# Patient Record
Sex: Female | Born: 1960 | Race: White | Hispanic: No | Smoking: Never smoker
Health system: Southern US, Community
[De-identification: ages and names within clinical notes are randomized; demographics above are authoritative.]

## PROBLEM LIST (undated history)

## (undated) DIAGNOSIS — F419 Anxiety disorder, unspecified: Secondary | ICD-10-CM

## (undated) DIAGNOSIS — M1990 Unspecified osteoarthritis, unspecified site: Secondary | ICD-10-CM

## (undated) DIAGNOSIS — I10 Essential (primary) hypertension: Secondary | ICD-10-CM

## (undated) DIAGNOSIS — E119 Type 2 diabetes mellitus without complications: Secondary | ICD-10-CM

## (undated) DIAGNOSIS — E785 Hyperlipidemia, unspecified: Secondary | ICD-10-CM

## (undated) HISTORY — DX: Unspecified osteoarthritis, unspecified site: M19.90

## (undated) HISTORY — PX: ABDOMINAL HYSTERECTOMY: SUR658

## (undated) HISTORY — PX: ABDOMINAL HYSTERECTOMY: SHX81

## (undated) HISTORY — DX: Hyperlipidemia, unspecified: E78.5

## (undated) HISTORY — DX: Anxiety disorder, unspecified: F41.9

---

## 2011-06-09 ENCOUNTER — Ambulatory Visit (INDEPENDENT_AMBULATORY_CARE_PROVIDER_SITE_OTHER): Payer: Medicaid Other | Admitting: Family Medicine

## 2011-06-09 ENCOUNTER — Encounter: Payer: Self-pay | Admitting: Family Medicine

## 2011-06-09 DIAGNOSIS — E785 Hyperlipidemia, unspecified: Secondary | ICD-10-CM

## 2011-06-09 DIAGNOSIS — R1011 Right upper quadrant pain: Secondary | ICD-10-CM

## 2011-06-09 DIAGNOSIS — E119 Type 2 diabetes mellitus without complications: Secondary | ICD-10-CM

## 2011-06-09 DIAGNOSIS — I10 Essential (primary) hypertension: Secondary | ICD-10-CM

## 2011-06-09 LAB — COMPREHENSIVE METABOLIC PANEL
ALT: 14 U/L (ref 0–35)
AST: 15 U/L (ref 0–37)
Alkaline Phosphatase: 59 U/L (ref 39–117)
Glucose, Bld: 148 mg/dL — ABNORMAL HIGH (ref 70–99)
Potassium: 4.1 mEq/L (ref 3.5–5.3)
Sodium: 138 mEq/L (ref 135–145)
Total Bilirubin: 0.2 mg/dL — ABNORMAL LOW (ref 0.3–1.2)
Total Protein: 6.9 g/dL (ref 6.0–8.3)

## 2011-06-09 LAB — CBC WITH DIFFERENTIAL/PLATELET
Basophils Relative: 0 % (ref 0–1)
Eosinophils Absolute: 0.2 10*3/uL (ref 0.0–0.7)
Eosinophils Relative: 3 % (ref 0–5)
Lymphs Abs: 1.4 10*3/uL (ref 0.7–4.0)
MCH: 32.2 pg (ref 26.0–34.0)
MCHC: 33.9 g/dL (ref 30.0–36.0)
MCV: 95.1 fL (ref 78.0–100.0)
Platelets: 219 10*3/uL (ref 150–400)
RBC: 4.25 MIL/uL (ref 3.87–5.11)

## 2011-06-09 LAB — LIPID PANEL
HDL: 36 mg/dL — ABNORMAL LOW (ref >39)
LDL Cholesterol: 120 mg/dL — ABNORMAL HIGH (ref 0–99)
Total CHOL/HDL Ratio: 6.2 Ratio
VLDL: 66 mg/dL — ABNORMAL HIGH (ref 0–40)

## 2011-06-09 MED ORDER — ONDANSETRON HCL 4 MG PO TABS: 4.0000 mg | ORAL_TABLET | Freq: Three times a day (TID) | ORAL | Status: AC | PRN

## 2011-06-09 MED ORDER — ATENOLOL 25 MG PO TABS: 25.0000 mg | ORAL_TABLET | Freq: Every day | ORAL | Status: DC

## 2011-06-09 NOTE — Progress Notes (Signed)
Pt states that she has been experiencing pain in her RUQ for the past month. She states that the pain is constant but sometimes she experiences burning that will come and go along with some nausea. She feels better when she lies down. Laureen Ochs, Viann Shove   appt made for pt to have [place] @ [facility] on 03.12.2013 @ 815 am. Pt instructed to be NPO after midnight. Pt voiced understanding and agreed.Heath Gold '

## 2011-06-09 NOTE — Patient Instructions (Addendum)
It has been a pleasure to meet you today. I will call you with the labs results if they come back abnormal. Please make an follow up appointment as soon as ultrasound is done. If your symptoms worsen please schedule an appointment sooner. If your symptoms worsen or if fever, chills or not tolerating by mouth; please get evaluated right away.

## 2011-06-10 ENCOUNTER — Encounter: Payer: Self-pay | Admitting: Family Medicine

## 2011-06-10 DIAGNOSIS — R1011 Right upper quadrant pain: Secondary | ICD-10-CM | POA: Insufficient documentation

## 2011-06-10 DIAGNOSIS — E785 Hyperlipidemia, unspecified: Secondary | ICD-10-CM | POA: Insufficient documentation

## 2011-06-10 NOTE — Assessment & Plan Note (Signed)
No on current treatment Plan - Recheck lipid profile - Consider statin initiation if LDL above 100.

## 2011-06-10 NOTE — Assessment & Plan Note (Signed)
A1c 7.7.  -Continue metformin 500 daily. Ideal under 7, monitor next visit and probably increase metformin to 1000 BID

## 2011-06-10 NOTE — Assessment & Plan Note (Signed)
Most likely gall bladder, possible stones. Chronic in nature (1 month duration with no changes in intensity) a febrile some nausea present. Plan - Fat reduced diet - Abdominal Ultrasound - If worsening needs to be evaluated right away.

## 2011-06-10 NOTE — Progress Notes (Signed)
  Subjective:    Patient ID: Stacy Torres, female    DOB: [birth year], 51 y.o.   MRN: [id number]  HPI Patient that comes to establish care. She most recently to [facility] her daughter after her divorce. She has history of diabetes, HTN, and hyperlipidemia. Her main complaint today is right upper quadrant pain that started a month ago. She noticed it is related to food intake and colic in nature. No fevers, mild nausea and bilious, non-bloody one vomit, soften stools but not frank diarrhea. Past surgical history positive for complete hysterectomy 13 years ago. 1. Hypertension: Controlled with Benazepril 40 mg daily and atenolol 12.5 mg daily. Blood pressure today 142/82 the patient mentions she is usually controlled, these temporary elevation of blood pressure she attributes to pain. 2. DN: On metformin 500 mg daily. A1c today 7.7. No symptoms of hypoglycemia or secondary effects of metformin. 3. HLD: Does not recall how long ago was her last  Lipid profile check. Not pharmacologic treatment.   Review of Systems  Constitutional: Negative for fever, fatigue and unexpected weight change.  HENT: Negative.   Eyes: Negative.   Respiratory: Negative for cough and shortness of breath.   Cardiovascular: Negative for chest pain, palpitations and leg swelling.  Gastrointestinal: Positive for nausea, vomiting and abdominal pain. Negative for constipation, blood in stool and abdominal distention.  Genitourinary: Negative.   Musculoskeletal: Negative.   Skin: Negative.   Neurological: Negative.  Negative for headaches.  Psychiatric/Behavioral: Negative.        Objective:   Physical Exam  Constitutional: No distress.  HENT:  Mouth/Throat: Oropharynx is clear and moist.  Eyes: Conjunctivae and EOM are normal. Pupils are equal, round, and reactive to light.  Neck: Neck supple. No JVD present. No thyromegaly present.  Cardiovascular: Normal rate, regular rhythm and normal heart sounds.  Exam  reveals no gallop and no friction rub.   No murmur heard. Pulmonary/Chest: Effort normal and breath sounds normal. No respiratory distress.  Abdominal: Soft. Bowel sounds are normal. She exhibits no distension and no mass. There is tenderness. There is no rebound and no guarding.       RUQ tenderness. Positive Murphy sign. No hepatomegaly.  Lymphadenopathy:    She has no cervical adenopathy.  Skin: She is not diaphoretic.          Assessment & Plan:

## 2011-06-10 NOTE — Assessment & Plan Note (Addendum)
-  Continue treatment with beta blockers and ACE inhibitors. If blood pressures continue to be 140/90 consider increasing dose of current medications -Labs to evaluate creatinine/kidney function. -Continue monitoring.

## 2011-06-14 ENCOUNTER — Ambulatory Visit (HOSPITAL_COMMUNITY)
Admission: RE | Admit: 2011-06-14 | Discharge: 2011-06-14 | Disposition: A | Discharge status: Home / Self Care | Payer: Medicaid Other | Source: Ambulatory Visit | Attending: Family Medicine | Admitting: Family Medicine

## 2011-06-14 ENCOUNTER — Telehealth: Payer: Self-pay | Admitting: Family Medicine

## 2011-06-14 DIAGNOSIS — R1011 Right upper quadrant pain: Secondary | ICD-10-CM | POA: Insufficient documentation

## 2011-06-14 NOTE — Telephone Encounter (Signed)
Forwarded to pcp did not see any results for [place].Loralee Pacas [place]

## 2011-06-14 NOTE — Telephone Encounter (Signed)
Pt had [place] this AM and wants to know results asap.

## 2011-06-14 NOTE — Telephone Encounter (Signed)
Patient is calling initially to make appt for f/u after ultrasound.  The first appt Dr. Aviva Signs had open wasn't until 3/22 and she really thinks she needs to see Dr. Aviva Signs.  She is wondering if Dr. Aviva Signs could give her the results by phone.

## 2011-06-14 NOTE — Telephone Encounter (Signed)
Called pt and informed her that [place] results were neg. Negative abdominal ultrasound. Pt voiced understanding.Marland Kitchen Marland KitchenLoralee Pacas [place]ar

## 2011-06-29 NOTE — Telephone Encounter (Signed)
Has this been addressed?

## 2011-07-01 NOTE — Telephone Encounter (Signed)
Her abdominal ultrasound was normal. It is in review results under [place]. DP

## 2011-12-28 ENCOUNTER — Ambulatory Visit: Payer: Medicaid Other | Admitting: Family Medicine

## 2012-01-12 ENCOUNTER — Ambulatory Visit (INDEPENDENT_AMBULATORY_CARE_PROVIDER_SITE_OTHER): Payer: Medicaid Other | Admitting: Family Medicine

## 2012-01-12 ENCOUNTER — Encounter: Payer: Self-pay | Admitting: Family Medicine

## 2012-01-12 VITALS — BP 153/76 | HR 97 | Temp 99.1°F | Ht 60.0 in | Wt 191.4 lb

## 2012-01-12 DIAGNOSIS — E785 Hyperlipidemia, unspecified: Secondary | ICD-10-CM

## 2012-01-12 DIAGNOSIS — H9110 Presbycusis, unspecified ear: Secondary | ICD-10-CM

## 2012-01-12 DIAGNOSIS — I10 Essential (primary) hypertension: Secondary | ICD-10-CM

## 2012-01-12 DIAGNOSIS — H9113 Presbycusis, bilateral: Secondary | ICD-10-CM

## 2012-01-12 DIAGNOSIS — E119 Type 2 diabetes mellitus without complications: Secondary | ICD-10-CM

## 2012-01-12 DIAGNOSIS — K219 Gastro-esophageal reflux disease without esophagitis: Secondary | ICD-10-CM

## 2012-01-12 LAB — POCT GLYCOSYLATED HEMOGLOBIN (HGB A1C): Hemoglobin A1C: 8.3

## 2012-01-13 DIAGNOSIS — K219 Gastro-esophageal reflux disease without esophagitis: Secondary | ICD-10-CM | POA: Insufficient documentation

## 2012-01-13 MED ORDER — OMEPRAZOLE 20 MG PO CPDR: 20.0000 mg | DELAYED_RELEASE_CAPSULE | Freq: Every day | ORAL | Status: DC

## 2012-01-13 MED ORDER — LISINOPRIL 20 MG PO TABS: 20.0000 mg | ORAL_TABLET | Freq: Every day | ORAL | Status: DC

## 2012-01-15 NOTE — Progress Notes (Signed)
  Subjective:    Patient ID: Stacy Torres, female    DOB: 1960/12/15, 51 y.o.   MRN: [id number]  HPI Pt that comes today for follow up. Her abdominal discomfort is selective to certain foods (ie. Banana) and complains of acid reflux described as burning sensation that brings biter taste to her mouth. Denies visible blood/dark stools. Her other complaint is difficulty hearing: has been progressively worsening for the past years. Is mostly to hight pitch sounds. Denies headaches, tinnitus or dizziness associated with it. f/u DM: no very strict with diet lately. Taking metformin with no reported side effects or hypoglycemic events. F/u HTN: reports Benazepril has change color of pill and now is not as effective as use to be. Requests change to another antihypertensive medication.  Review of Systems Pt denies SOB, chest pain, palpitations, headaches, dizziness, numbness or weakness. No changes on urinary or BM habits. No unintentional weigh loss/gain.     Objective:   Physical Exam Gen:  NAD HEENT: Moist mucous membranes left ear with cerumen plug.  Normal TM bilaterally after removal of ear wax.  CV: Regular rate and rhythm, no murmurs rubs or gallops PULM: Clear to auscultation bilaterally.  ABD: Soft, non tender to palpation, non distended, normal bowel sounds EXT: No edema Neuro: Alert and oriented x3. No focalization Marena Chancy and Rinne examined after cerumen plug was extracted. No lateralization, bone conduction<air conduction. Audiogram only positive for frequency of 500 Hz. Rest of frequencies not heard bilaterally.      Assessment & Plan:

## 2012-01-17 DIAGNOSIS — H9113 Presbycusis, bilateral: Secondary | ICD-10-CM | POA: Insufficient documentation

## 2012-01-17 NOTE — Assessment & Plan Note (Addendum)
Symptoms consistent with GERD. Uncontrolled DM may be also having a component of autonomic dysfunction. No signs of GI bleed.  Plan: Protonix 40 mg daily and reevaluate in 1 month.

## 2012-01-17 NOTE — Assessment & Plan Note (Signed)
Pt requesting change in BP medication. Plan: Stop benazepril. Start Lisinopril 20 mg daily and keep a BP log. Reassess in a month or sooner if BP are elevated. Pt appropriately instructed.

## 2012-01-17 NOTE — Assessment & Plan Note (Signed)
Results of lipid profile discussed. Pt informed about risk of elevated LDL, Low HDL and hypertriglyceridemia on a DM pt that increases risk of cardiovascular events. Se decided to address this issue at her next appointment since she will be starting to take two new medications.

## 2012-01-17 NOTE — Assessment & Plan Note (Signed)
No lateralization on Webber. Normal Rinne. Audiogram only positive for low frequency sounds. No other symptoms, rest of neurologic exam intact.  Plan: Explained the early presentation of this condition and advise to get evaluated by ENT if pt would like to improve hearing with aids or if other symptoms appear. Pt does not desire to pursue more extensive evaluation at this point.

## 2012-01-17 NOTE — Assessment & Plan Note (Signed)
A1C 8.3 from 7.7. Pt on metformin reports compliance.  Plan: Considered adding Glipizide to her regimen. Pt will like to try with more strict diet and current medication, since she acknowledge is not watching what she is eating.

## 2012-02-09 ENCOUNTER — Ambulatory Visit: Payer: Medicaid Other | Admitting: Family Medicine

## 2012-03-21 ENCOUNTER — Encounter: Payer: Self-pay | Admitting: Family Medicine

## 2012-03-21 ENCOUNTER — Ambulatory Visit (INDEPENDENT_AMBULATORY_CARE_PROVIDER_SITE_OTHER): Payer: Medicaid Other | Admitting: Family Medicine

## 2012-03-21 VITALS — BP 163/67 | HR 79 | Temp 98.2°F | Ht 60.0 in | Wt 192.7 lb

## 2012-03-21 DIAGNOSIS — E669 Obesity, unspecified: Secondary | ICD-10-CM | POA: Insufficient documentation

## 2012-03-21 DIAGNOSIS — E119 Type 2 diabetes mellitus without complications: Secondary | ICD-10-CM

## 2012-03-21 DIAGNOSIS — E785 Hyperlipidemia, unspecified: Secondary | ICD-10-CM

## 2012-03-21 DIAGNOSIS — K219 Gastro-esophageal reflux disease without esophagitis: Secondary | ICD-10-CM

## 2012-03-21 DIAGNOSIS — I10 Essential (primary) hypertension: Secondary | ICD-10-CM

## 2012-03-21 MED ORDER — ATENOLOL 25 MG PO TABS: 25.0000 mg | ORAL_TABLET | Freq: Every day | ORAL | Status: DC

## 2012-03-21 MED ORDER — OMEPRAZOLE 20 MG PO CPDR: 20.0000 mg | DELAYED_RELEASE_CAPSULE | Freq: Every day | ORAL | Status: DC

## 2012-03-21 MED ORDER — ACCU-CHEK NANO SMARTVIEW W/DEVICE KIT: 1.0000 | PACK | Freq: Every day | Status: DC

## 2012-03-21 MED ORDER — ACCU-CHEK FASTCLIX LANCETS MISC: 1.0000 | Freq: Three times a day (TID) | Status: DC

## 2012-03-21 MED ORDER — GLUCOSE BLOOD VI STRP: ORAL_STRIP | Status: DC

## 2012-03-21 MED ORDER — METFORMIN HCL ER 500 MG PO TB24: 1000.0000 mg | ORAL_TABLET | Freq: Every day | ORAL | Status: DC

## 2012-03-21 MED ORDER — LISINOPRIL 40 MG PO TABS: 20.0000 mg | ORAL_TABLET | Freq: Every day | ORAL | Status: DC

## 2012-03-21 NOTE — Assessment & Plan Note (Signed)
No ready to start another medication. She would like to discussed it next appt. informed risks of elevated cholesterol on a DM patient. Pt voiced understanding.

## 2012-03-21 NOTE — Patient Instructions (Addendum)
It has been a pleasure to see you today. Please take the medications as prescribed. Continue taking lisinopril 20 mg daily (now the pill is 40 mg so you will be taking half) Increase atenolol to 1 tab a day (25 mg) Please keep a log of your Blood Pressure. For your diabetes continue taking metformin 1000 mg daily and we will recheck at your next appointment. Make your next appointment in 1-2 months.

## 2012-03-21 NOTE — Assessment & Plan Note (Signed)
Pt on atenolol 25 taking 1/2 tablet and Lisinopril 20 mg . BP has not been checked during this time. Here in the office today 163 systolic with normal pulse.  Plan: Incerase atenolol to 1 tab. Lisinopril increased to 40 mg prescription but discussed with pt start with 0.5 pil and if after change on atenolol Bp still above 140/90 start taking 1 full tablet daily. Instructed pt to keep a log of her BP to discuss at her next appointment.

## 2012-03-21 NOTE — Progress Notes (Signed)
Family Medicine Office Visit Note   Subjective:   Patient ID: Stacy Torres, female  DOB: 1960-07-22, 51 y.o.. MRN: [id number]   Pt that comes today to f/u hypertension.  HTN: reports to be compliant with meds and denies side effects. She has not been checked her BP since her last appointment here.  She reports her GI symptoms are controlled and feels better. Desires continue with protonix since she has noticed a significant improvement.  Pt declines today checking her A1C since she reports has not been eating properly and knows it will be high. Prefers to check on next appointment. She reports today that she does not have meter and interested in checking her CBG's at home.  Desires to postpone discussion of  HLD pharmacologic treatment  today. Would like to give diet a try.   Review of Systems:   Denies SOB, chest pain, palpitations, headaches, dizziness, numbness or weakness. No changes on urinary or BM habits. No unintentional weigh loss/gain.  Objective:   Physical Exam: Gen:  NAD HEENT: Moist mucous membranes  CV: Regular rate and rhythm, no murmurs rubs or gallops PULM: Clear to auscultation bilaterally.  ABD: Soft, non tender, non distended, normal bowel sounds EXT: No edema Neuro: Alert and oriented x3. No focalization  Assessment & Plan:

## 2012-03-21 NOTE — Assessment & Plan Note (Signed)
Declines A1C testing today. Compliant with meds. Non compliant with diet. Plan: Continue current regimen. Meter, lancets and strips prescribed. Teaching during office visit how to use them. Will recheck on her next appointment and adjust metformin dose.

## 2012-03-21 NOTE — Assessment & Plan Note (Signed)
Controlled with Protonix. Continue current treatment.

## 2012-06-11 ENCOUNTER — Ambulatory Visit: Payer: Medicaid Other | Admitting: Family Medicine

## 2012-08-06 ENCOUNTER — Ambulatory Visit (INDEPENDENT_AMBULATORY_CARE_PROVIDER_SITE_OTHER): Payer: Medicaid Other | Admitting: Family Medicine

## 2012-08-06 ENCOUNTER — Encounter: Payer: Self-pay | Admitting: Family Medicine

## 2012-08-06 VITALS — BP 128/73 | HR 92 | Temp 99.2°F | Ht 60.0 in | Wt 192.0 lb

## 2012-08-06 DIAGNOSIS — E785 Hyperlipidemia, unspecified: Secondary | ICD-10-CM

## 2012-08-06 DIAGNOSIS — I10 Essential (primary) hypertension: Secondary | ICD-10-CM

## 2012-08-06 DIAGNOSIS — E669 Obesity, unspecified: Secondary | ICD-10-CM

## 2012-08-06 DIAGNOSIS — E119 Type 2 diabetes mellitus without complications: Secondary | ICD-10-CM

## 2012-08-06 MED ORDER — METFORMIN HCL 1000 MG PO TABS: 1000.0000 mg | ORAL_TABLET | Freq: Two times a day (BID) | ORAL | Status: DC

## 2012-08-06 MED ORDER — ATENOLOL 25 MG PO TABS: 25.0000 mg | ORAL_TABLET | Freq: Every day | ORAL | Status: DC

## 2012-08-06 MED ORDER — LISINOPRIL 40 MG PO TABS: 20.0000 mg | ORAL_TABLET | Freq: Every day | ORAL | Status: DC

## 2012-08-06 NOTE — Patient Instructions (Addendum)
It has been a pleasure to see you today. Please take the medications as prescribed. I will call you with the labs results if they come back abnormal otherwise we will discuss them at your next appointment. Make your next appointment in 3-4 months or sooner if need.

## 2012-08-09 ENCOUNTER — Telehealth: Payer: Self-pay | Admitting: Family Medicine

## 2012-08-09 MED ORDER — ATORVASTATIN CALCIUM 40 MG PO TABS: 40.0000 mg | ORAL_TABLET | Freq: Every day | ORAL | Status: DC

## 2012-08-09 NOTE — Assessment & Plan Note (Signed)
Controlled.  P/ Continue current regimen.

## 2012-08-09 NOTE — Assessment & Plan Note (Signed)
Weight stable at 192 Lb. Pt not motivated to lose weigh at this time.

## 2012-08-09 NOTE — Assessment & Plan Note (Addendum)
A1C increasing. 8.6 today. No hypoglycemic episodes, denies polydipsia/polyuria. P/ -Increase metformin to 1000 mg BID -Will consider adding Glucotrol if DM continues uncontrolled. -pt on Lisinopril (no microalbuminuria analysis indicated) -Opthalmology referral done today -Will recheck labs for her next appointment 3-4 months (cbc, cmet, lipid panel)

## 2012-08-09 NOTE — Progress Notes (Signed)
Family Medicine Office Visit Note   Subjective:   Patient ID: Stacy Torres, female  DOB: [birth year], 52 y.o.. MRN: [id number]   Pt that comes today to f/u her chronic conditions. She reports feeling well and has no current complaints. She states is compliant with her treatment plan and denies side effect of medications noted.   Review of Systems:  Pt denies SOB, chest pain, palpitations, headaches, dizziness, numbness or weakness. No changes on urinary or BM habits. No unintentional weigh loss/gain.  Objective:   Physical Exam: Gen:  NAD HEENT: Moist mucous membranes  CV: Regular rate and rhythm, no murmurs rubs or gallops PULM: Clear to auscultation bilaterally. No wheezes/rales/rhonchi ABD: Soft, non tender, non distended, normal bowel sounds EXT: No edema Neuro: Alert and oriented x3. No focalization  Assessment & Plan:

## 2012-08-09 NOTE — Telephone Encounter (Signed)
Patient is calling because when she was seen on Tuesday, she was told that along with her normal 3 meds, there would a new one for Cholesterol, she says it is a "Statin", but the pharmacy has not received it yet.

## 2012-08-09 NOTE — Telephone Encounter (Signed)
Left message on voicemail. Sopheap Boehle S  

## 2012-08-09 NOTE — Assessment & Plan Note (Addendum)
Pt DM and estimated ASCVD risk of 7.9%  P/ High intensity statin therapy discussed with pt and agreeable Start Lipitor 40 mg

## 2012-08-09 NOTE — Telephone Encounter (Signed)
Medication sent to pharmacy. Sorry for the delay. Please communicate this to the pt.

## 2012-08-27 ENCOUNTER — Encounter (HOSPITAL_COMMUNITY): Payer: Self-pay | Admitting: Emergency Medicine

## 2012-08-27 ENCOUNTER — Emergency Department (HOSPITAL_COMMUNITY)
Admission: EM | Admit: 2012-08-27 | Discharge: 2012-08-28 | Discharge status: Against Medical Advice | Payer: Medicaid Other | Attending: Emergency Medicine | Admitting: Emergency Medicine

## 2012-08-27 DIAGNOSIS — Y9389 Activity, other specified: Secondary | ICD-10-CM | POA: Insufficient documentation

## 2012-08-27 DIAGNOSIS — Y92410 Unspecified street and highway as the place of occurrence of the external cause: Secondary | ICD-10-CM | POA: Insufficient documentation

## 2012-08-27 DIAGNOSIS — S3981XA Other specified injuries of abdomen, initial encounter: Secondary | ICD-10-CM | POA: Insufficient documentation

## 2012-08-27 HISTORY — DX: Type 2 diabetes mellitus without complications: E11.9

## 2012-08-27 HISTORY — DX: Essential (primary) hypertension: I10

## 2012-08-27 LAB — COMPREHENSIVE METABOLIC PANEL
ALT: 31 U/L (ref 0–35)
Albumin: 3.6 g/dL (ref 3.5–5.2)
Calcium: 10.6 mg/dL — ABNORMAL HIGH (ref 8.4–10.5)
GFR calc Af Amer: >90 mL/min (ref >90)
Glucose, Bld: 200 mg/dL — ABNORMAL HIGH (ref 70–99)
Sodium: 138 mEq/L (ref 135–145)
Total Protein: 7 g/dL (ref 6.0–8.3)

## 2012-08-27 LAB — URINALYSIS, ROUTINE W REFLEX MICROSCOPIC
Bilirubin Urine: NEGATIVE
Glucose, UA: NEGATIVE mg/dL
Hgb urine dipstick: NEGATIVE
Ketones, ur: NEGATIVE mg/dL
Leukocytes, UA: NEGATIVE
Nitrite: NEGATIVE
Protein, ur: NEGATIVE mg/dL
Specific Gravity, Urine: 1.019 (ref 1.005–1.030)
Urobilinogen, UA: 0.2 mg/dL (ref 0.0–1.0)
pH: 5.5 (ref 5.0–8.0)

## 2012-08-27 LAB — CBC WITH DIFFERENTIAL/PLATELET
Basophils Relative: 0 % (ref 0–1)
Eosinophils Absolute: 0.5 10*3/uL (ref 0.0–0.7)
Eosinophils Relative: 6 % — ABNORMAL HIGH (ref 0–5)
Lymphs Abs: 2.3 10*3/uL (ref 0.7–4.0)
MCH: 32.4 pg (ref 26.0–34.0)
MCHC: 34.5 g/dL (ref 30.0–36.0)
MCV: 94.1 fL (ref 78.0–100.0)
Platelets: 230 10*3/uL (ref 150–400)
RBC: 4.1 MIL/uL (ref 3.87–5.11)
RDW: 12.8 % (ref 11.5–15.5)

## 2012-08-27 LAB — POCT I-STAT TROPONIN I: Troponin i, poc: 0 ng/mL (ref 0.00–0.08)

## 2012-08-27 NOTE — ED Notes (Signed)
NURSE FIRST ROUNDS : NURSE EXPLAINED DELAY , WAIT TIME AND PROCESS , RESPIRATIONS UNLABORED , DENIES PAIN AT THIS TIME , AMBULATORY .
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
## 2012-08-27 NOTE — ED Notes (Signed)
Call no answer times 2 

## 2013-04-23 ENCOUNTER — Telehealth: Payer: Self-pay | Admitting: Home Health Services

## 2013-04-23 NOTE — Telephone Encounter (Signed)
LM for patient to schedule follow up diabetic visit with PCP[facility].  Needs A1c and LDL.

## 2013-08-26 ENCOUNTER — Telehealth: Payer: Self-pay | Admitting: Family Medicine

## 2013-08-26 NOTE — Telephone Encounter (Signed)
Pt called and needs refills on her Metformin and Atenolol. jw

## 2013-08-27 ENCOUNTER — Other Ambulatory Visit: Payer: Self-pay | Admitting: *Deleted

## 2013-08-27 DIAGNOSIS — E119 Type 2 diabetes mellitus without complications: Secondary | ICD-10-CM

## 2013-08-27 DIAGNOSIS — I10 Essential (primary) hypertension: Secondary | ICD-10-CM

## 2013-08-27 MED ORDER — LISINOPRIL 40 MG PO TABS: 20.0000 mg | ORAL_TABLET | Freq: Every day | ORAL | Status: DC

## 2013-08-27 MED ORDER — ATENOLOL 25 MG PO TABS: 25.0000 mg | ORAL_TABLET | Freq: Every day | ORAL | Status: DC

## 2013-08-27 MED ORDER — METFORMIN HCL 1000 MG PO TABS: 1000.0000 mg | ORAL_TABLET | Freq: Two times a day (BID) | ORAL | Status: DC

## 2013-08-27 NOTE — Telephone Encounter (Signed)
Prescription(s) have been sent

## 2013-08-27 NOTE — Telephone Encounter (Signed)
Patient calls again, completely out of the meds in the previous msg. Please refill today.

## 2014-05-25 ENCOUNTER — Telehealth: Payer: Self-pay | Admitting: Home Health Services

## 2014-05-25 NOTE — Telephone Encounter (Signed)
LVM time to schedule appointment with PCP for DM management.

## 2014-06-16 ENCOUNTER — Telehealth: Payer: Self-pay | Admitting: *Deleted

## 2014-06-16 NOTE — Telephone Encounter (Signed)
Left message on voicemail for patient to schedule an appointment for DM management.

## 2015-02-03 ENCOUNTER — Ambulatory Visit (INDEPENDENT_AMBULATORY_CARE_PROVIDER_SITE_OTHER): Payer: Medicaid Other | Admitting: Family Medicine

## 2015-02-03 ENCOUNTER — Encounter: Payer: Self-pay | Admitting: Family Medicine

## 2015-02-03 VITALS — BP 160/78 | HR 83 | Temp 98.8°F | Ht 60.0 in | Wt 179.0 lb

## 2015-02-03 DIAGNOSIS — H6693 Otitis media, unspecified, bilateral: Secondary | ICD-10-CM

## 2015-02-03 DIAGNOSIS — I10 Essential (primary) hypertension: Secondary | ICD-10-CM

## 2015-02-03 DIAGNOSIS — L409 Psoriasis, unspecified: Secondary | ICD-10-CM | POA: Insufficient documentation

## 2015-02-03 DIAGNOSIS — L989 Disorder of the skin and subcutaneous tissue, unspecified: Secondary | ICD-10-CM

## 2015-02-03 DIAGNOSIS — H7293 Unspecified perforation of tympanic membrane, bilateral: Secondary | ICD-10-CM

## 2015-02-03 DIAGNOSIS — E119 Type 2 diabetes mellitus without complications: Secondary | ICD-10-CM | POA: Diagnosis not present

## 2015-02-03 DIAGNOSIS — K219 Gastro-esophageal reflux disease without esophagitis: Secondary | ICD-10-CM | POA: Diagnosis not present

## 2015-02-03 DIAGNOSIS — Z0000 Encounter for general adult medical examination without abnormal findings: Secondary | ICD-10-CM | POA: Insufficient documentation

## 2015-02-03 DIAGNOSIS — E785 Hyperlipidemia, unspecified: Secondary | ICD-10-CM | POA: Diagnosis not present

## 2015-02-03 LAB — POCT SKIN KOH: Skin KOH, POC: NEGATIVE

## 2015-02-03 LAB — POCT GLYCOSYLATED HEMOGLOBIN (HGB A1C): HEMOGLOBIN A1C: 8.7

## 2015-02-03 MED ORDER — ATENOLOL 25 MG PO TABS: 25.0000 mg | ORAL_TABLET | Freq: Every day | ORAL | Status: DC

## 2015-02-03 MED ORDER — FLUTICASONE PROPIONATE 50 MCG/ACT NA SUSP: 2.0000 | Freq: Every day | NASAL | Status: DC

## 2015-02-03 MED ORDER — ATORVASTATIN CALCIUM 40 MG PO TABS: 40.0000 mg | ORAL_TABLET | Freq: Every day | ORAL | Status: DC

## 2015-02-03 MED ORDER — OMEPRAZOLE 20 MG PO CPDR: 20.0000 mg | DELAYED_RELEASE_CAPSULE | Freq: Every day | ORAL | Status: DC

## 2015-02-03 MED ORDER — LISINOPRIL 40 MG PO TABS: 20.0000 mg | ORAL_TABLET | Freq: Every day | ORAL | Status: DC

## 2015-02-03 MED ORDER — CLOBETASOL PROPIONATE 0.05 % EX OINT: 1.0000 "application " | TOPICAL_OINTMENT | Freq: Two times a day (BID) | CUTANEOUS | Status: DC

## 2015-02-03 MED ORDER — GLIPIZIDE 5 MG PO TABS: 5.0000 mg | ORAL_TABLET | Freq: Every day | ORAL | Status: DC

## 2015-02-03 MED ORDER — AMOXICILLIN-POT CLAVULANATE 875-125 MG PO TABS: 1.0000 | ORAL_TABLET | Freq: Two times a day (BID) | ORAL | Status: DC

## 2015-02-03 MED ORDER — OFLOXACIN 0.3 % OP SOLN: 10.0000 [drp] | Freq: Two times a day (BID) | OPHTHALMIC | Status: DC

## 2015-02-03 NOTE — Assessment & Plan Note (Signed)
-   Prescription for Augmentin double strength given to take BID x10 days - Ofloxacin drops - Flonase - Return in fever develops

## 2015-02-03 NOTE — Assessment & Plan Note (Addendum)
-   KOH negative - Clobetazole x2 weeks. Avoid face, axilla, and armpits - Follow up in two weeks

## 2015-02-03 NOTE — Progress Notes (Signed)
Subjective:     Patient ID: Stacy Torres, female   DOB: [birth year], 54 y.o.   MRN: [id number]  HPI Mrs. Nwosu is a 54yo female presenting today for annual physical. # Diabetes: - Last A1C of 8.6 in 08/06/2012 - Currently prescribed Metformin 1000mg  BID - States sugars normally range from 90s to 120s. She normally checks her blood sugars in the mornings, however she occasionally checks at night as well  # Hypertension: - Requests refill of Atenolol and Lisinopril - States she has been out of medication for several days - States that on medication, her blood pressure normally runs in 130s/60s  # Rash: - Notes circular lesions on arms and hands bilaterally and left leg. Has been present for several years. Itches. Not painful - Has been told in the past it is ringworm. Reports she lives with several dogs. - Reports history of psoriasis. Note chart review without any mention of psoriasis and not currently on problem list. Not currently on medication.  # Hyperlipidemia: - Requests refill of Atorvastatin. Has been out of this medication for a long time.  # GERD: - Requests refill of Omeprazole - Has been using over the counter pepcid since she has been out of her prescription medication - Notes epigastric abdominal pain when lying supine. No chest pain with exertion  # Ear Pain: - Notes bilateral ear pain with green drainage - Has been present x3 months - Denies fever or other systemic symptoms - Has history of prior ear infection many years ago  # Health Maintenance: - History of full hysterectomy including cervix and ovaries more than ten years ago. No longer requiring pap smears. - Refuses colonoscopy - Not up to date on mammograms, with last one a few years ago - Refuses flu shot. Notes history of Guillain-Barre.   Review of Systems Per HPI    Objective:   Physical Exam  Constitutional: She appears well-developed and well-nourished. No distress.  HENT:  Head:  Normocephalic and atraumatic.  Mouth/Throat: Oropharynx is clear and moist.  Ruptured tympanic membrane with drainage noted bilaterally with some erythema of tympanic canal.  Cardiovascular: Normal rate and regular rhythm.  Exam reveals no gallop and no friction rub.   No murmur heard. Pulmonary/Chest: Effort normal. No respiratory distress. She has no wheezes. She has no rales.  Abdominal: Soft. Bowel sounds are normal. She exhibits no distension. There is no tenderness.  Musculoskeletal: She exhibits no edema.  Wasting of right lower extremity (secondary to Guillain-Barre per patient) wit limb noticably smaller than left lower extremity, negative Homan's sign bilaterally  Lymphadenopathy:    She has no cervical adenopathy.  Skin: Skin is warm.  Circular lesion with erythematous raised edges noted on forearms bilaterally, less symmetrical lesion with erythematous raised edged and silver plaque noted on right hand and left lower leg.  Psychiatric: She has a normal mood and affect. Her behavior is normal.           Assessment and Plan:     HTN (hypertension) - Refill of Lisinopril and Atenolol given. Has been out of medication for several days. - Recheck at next visit  GERD (gastroesophageal reflux disease) - Refill of Omeprazole given  DM (diabetes mellitus) - A1C 8.7, up from 8.6 two years ago - Continue Metformin 1000mg  BID - Initiate Glipizide 5mg  with breakfast - Contact office if adverse effects, such as hypoglycemia, occurs with medication - Counseled on diet and exercise.  - Follow up in two weeks to discuss Glipizide.  Follow up in three months to recheck A1C  Hyperlipidemia - Refill of Atorvastatin given  Otitis media of both ears with spontaneous rupture of tympanic membrane - Prescription for Augmentin double strength given to take BID x10 days - Ofloxacin drops - Flonase - Return in fever develops  Psoriasis - KOH negative - Clobetazole x2 weeks. Avoid  face, axilla, and armpits - Follow up in two weeks  Health care maintenance - Handout given concerning mammogram - Refuses colonoscopy. Discussed importance of screening. To contact office if she changes her mind

## 2015-02-03 NOTE — Assessment & Plan Note (Signed)
-   Handout given concerning mammogram - Refuses colonoscopy. Discussed importance of screening. To contact office if she changes her mind

## 2015-02-03 NOTE — Assessment & Plan Note (Signed)
-   Refill of Atorvastatin given

## 2015-02-03 NOTE — Assessment & Plan Note (Signed)
-   Refill of Omeprazole given

## 2015-02-03 NOTE — Assessment & Plan Note (Addendum)
-   A1C 8.7, up from 8.6 two years ago - Continue Metformin 1000mg  BID - Initiate Glipizide 5mg  with breakfast - Contact office if adverse effects, such as hypoglycemia, occurs with medication - Counseled on diet and exercise.  - Follow up in two weeks to discuss Glipizide. Follow up in three months to recheck A1C

## 2015-02-03 NOTE — Patient Instructions (Signed)
Thanks for coming to see me today! I have sent in a refill of your medications. I have also sent in an antibiotic for you to take twice a day for the next ten days and a nasal spray to help with the drainage.  Please call and schedule an appointment for a mammogram. I will let you know the results of your skin scraping and send a medication to the pharmacy when I can.  Thanks again! Dr. Gerlean Ren

## 2015-02-03 NOTE — Assessment & Plan Note (Signed)
-   Refill of Lisinopril and Atenolol given. Has been out of medication for several days. - Recheck at next visit

## 2015-02-05 ENCOUNTER — Telehealth: Payer: Self-pay | Admitting: Family Medicine

## 2015-02-05 DIAGNOSIS — E119 Type 2 diabetes mellitus without complications: Secondary | ICD-10-CM

## 2015-02-05 MED ORDER — METFORMIN HCL 1000 MG PO TABS: 1000.0000 mg | ORAL_TABLET | Freq: Two times a day (BID) | ORAL | Status: DC

## 2015-02-05 NOTE — Telephone Encounter (Signed)
Called to confirm understanding of all medications prescribed given high number of changes. States she understands all changes and is feeling much better. Will follow up in 2-3 weeks to monitor for improvement of ear infection and rash.  Requests refill of Metformin, which was given.  Dr. Gerlean Ren

## 2015-03-22 ENCOUNTER — Telehealth: Payer: Self-pay | Admitting: Family Medicine

## 2015-03-22 DIAGNOSIS — I10 Essential (primary) hypertension: Secondary | ICD-10-CM

## 2015-03-22 MED ORDER — LISINOPRIL 40 MG PO TABS: 20.0000 mg | ORAL_TABLET | Freq: Every day | ORAL | Status: DC

## 2015-03-22 MED ORDER — LISINOPRIL 40 MG PO TABS: 40.0000 mg | ORAL_TABLET | Freq: Every day | ORAL | Status: DC

## 2015-03-22 NOTE — Telephone Encounter (Signed)
Needs refill on lisionpril.  The last RX was for 20 mg and that's not strong enough. Last pill was taken [facility] on [address]

## 2015-03-22 NOTE — Telephone Encounter (Signed)
All prescriptions for Lisinopril in system for 20mg . Contacted patient and she states a doctor in another system increased prescription to 40mg  and she has been on this for the last two years. Did not mention increase in dosage at last office visit. May take Lisinopril 40mg  daily. Instructed to follow up in office for blood pressure check.

## 2015-03-22 NOTE — Telephone Encounter (Signed)
Pt called back and does not understand why the doctor decreased her Lisinopril. Please call the patient. jw

## 2015-03-22 NOTE — Telephone Encounter (Signed)
Please have her schedule a follow up appointment to check her blood pressure and adjust her medication if needed.

## 2015-04-07 ENCOUNTER — Ambulatory Visit (INDEPENDENT_AMBULATORY_CARE_PROVIDER_SITE_OTHER): Payer: Medicaid Other | Admitting: Family Medicine

## 2015-04-07 ENCOUNTER — Encounter: Payer: Self-pay | Admitting: Family Medicine

## 2015-04-07 VITALS — BP 140/69 | HR 89 | Temp 98.3°F | Ht 60.0 in | Wt 182.2 lb

## 2015-04-07 DIAGNOSIS — E119 Type 2 diabetes mellitus without complications: Secondary | ICD-10-CM

## 2015-04-07 DIAGNOSIS — I10 Essential (primary) hypertension: Secondary | ICD-10-CM

## 2015-04-07 DIAGNOSIS — B372 Candidiasis of skin and nail: Secondary | ICD-10-CM | POA: Diagnosis not present

## 2015-04-07 MED ORDER — LISINOPRIL 40 MG PO TABS: 40.0000 mg | ORAL_TABLET | Freq: Every day | ORAL | Status: DC

## 2015-04-07 MED ORDER — ZINC OXIDE 20 % EX OINT: 1.0000 "application " | TOPICAL_OINTMENT | Freq: Two times a day (BID) | CUTANEOUS | Status: DC

## 2015-04-07 NOTE — Patient Instructions (Signed)
Thank you so much for coming to visit me today! I have sent in a refill for Lisinopril and a prescription for a cream that will work as an antifungal agent and a barrier cream.  Follow up in 1-2 months to check your A1C. Please record your blood pressures and blood sugars and bring them to your next visit.  Thanks again! Dr. Gerlean Ren

## 2015-04-10 DIAGNOSIS — B372 Candidiasis of skin and nail: Secondary | ICD-10-CM | POA: Insufficient documentation

## 2015-04-10 NOTE — Assessment & Plan Note (Addendum)
-   Rash consistent with candidal infection - Hydrocortisone-Nystatin-Zinc Oxide cream prescribed - Follow up at next office visit

## 2015-04-10 NOTE — Assessment & Plan Note (Signed)
-   Has not been check blood sugars, but has equipment at home to do so and does not need refills - Recommend checking blood sugars and recording in notebook. To bring notebook to next office visit - Follow up in one month for A1C check

## 2015-04-10 NOTE — Progress Notes (Signed)
Subjective:     Patient ID: Stacy Torres, female   DOB: 07-05-1960, 55 y.o.   MRN: [id number]  HPI Stacy Torres is a 55yo female presenting today to discuss medication change. - Recently transitioned from Lisinopril 20mg  to 40mg  - Also recently initiated Glipizide - Denies any side effects or complications with either medication - States blood pressure has been much better controlled at home, but cannot recall what they are and has not been writing them down. - Notes tenderness and rash at top of intergluteal cleft. Has been present for several days.   - History reviewed  Review of Systems Per HPI. Other systems negative.    Objective:   Physical Exam  Constitutional: She appears well-developed and well-nourished. No distress.  HENT:  Head: Normocephalic and atraumatic.  Cardiovascular: Normal rate and regular rhythm.  Exam reveals no gallop and no friction rub.   No murmur heard. Pulmonary/Chest: Effort normal. No respiratory distress. She has no wheezes.  Abdominal: Soft. She exhibits no distension. There is no tenderness.  Skin:  Raw erythematous rash with satellite lesions noted at superior intergluteal cleft, no ulcerations noted  Psychiatric: She has a normal mood and affect. Her behavior is normal.        Assessment and Plan:     HTN (hypertension) - Blood pressure improved to 140/69 today from those reported at home - Recommend obtaining notebook and recording readings at home to be followed up at next office visit  DM (diabetes mellitus) - Has not been check blood sugars, but has equipment at home to do so and does not need refills - Recommend checking blood sugars and recording in notebook. To bring notebook to next office visit - Follow up in one month for A1C check  Candidal skin infection - Rash consistent with candidal infection - Hydrocortisone-Nystatin-Zinc Oxide cream prescribed - Follow up at next office visit

## 2015-04-10 NOTE — Assessment & Plan Note (Signed)
-   Blood pressure improved to 140/69 today from those reported at home - Recommend obtaining notebook and recording readings at home to be followed up at next office visit

## 2016-01-10 ENCOUNTER — Other Ambulatory Visit: Payer: Self-pay | Admitting: Family Medicine

## 2016-01-10 DIAGNOSIS — I10 Essential (primary) hypertension: Secondary | ICD-10-CM

## 2016-02-18 ENCOUNTER — Other Ambulatory Visit: Payer: Self-pay | Admitting: Family Medicine

## 2016-02-18 DIAGNOSIS — I10 Essential (primary) hypertension: Secondary | ICD-10-CM

## 2016-02-18 DIAGNOSIS — K219 Gastro-esophageal reflux disease without esophagitis: Secondary | ICD-10-CM

## 2016-02-18 MED ORDER — ATENOLOL 25 MG PO TABS: 25.0000 mg | ORAL_TABLET | Freq: Every day | ORAL | 3 refills | Status: DC

## 2016-02-18 NOTE — Telephone Encounter (Signed)
Pt is calling for a refill on her Atenolol. She said that the pharmacy sent [place] a fax last Friday and she has not heard anything. We did have our fax machine down all Friday and through the weekend. Can we get this called in today. jw

## 2016-02-29 ENCOUNTER — Encounter: Payer: Medicaid Other | Admitting: Family Medicine

## 2016-03-24 ENCOUNTER — Encounter: Payer: Medicaid Other | Admitting: Family Medicine

## 2016-04-07 ENCOUNTER — Encounter: Payer: Medicaid Other | Admitting: Family Medicine

## 2016-04-14 ENCOUNTER — Encounter: Payer: Self-pay | Admitting: Family Medicine

## 2016-04-14 ENCOUNTER — Other Ambulatory Visit: Payer: Self-pay | Admitting: *Deleted

## 2016-04-14 ENCOUNTER — Ambulatory Visit (INDEPENDENT_AMBULATORY_CARE_PROVIDER_SITE_OTHER): Payer: Medicaid Other | Admitting: Family Medicine

## 2016-04-14 VITALS — BP 132/74 | HR 66 | Temp 98.1°F | Ht 60.0 in | Wt 169.8 lb

## 2016-04-14 DIAGNOSIS — L409 Psoriasis, unspecified: Secondary | ICD-10-CM | POA: Diagnosis not present

## 2016-04-14 DIAGNOSIS — K219 Gastro-esophageal reflux disease without esophagitis: Secondary | ICD-10-CM

## 2016-04-14 DIAGNOSIS — I10 Essential (primary) hypertension: Secondary | ICD-10-CM | POA: Diagnosis not present

## 2016-04-14 DIAGNOSIS — E119 Type 2 diabetes mellitus without complications: Secondary | ICD-10-CM | POA: Diagnosis present

## 2016-04-14 DIAGNOSIS — E784 Other hyperlipidemia: Secondary | ICD-10-CM | POA: Diagnosis not present

## 2016-04-14 DIAGNOSIS — Z114 Encounter for screening for human immunodeficiency virus [HIV]: Secondary | ICD-10-CM | POA: Diagnosis not present

## 2016-04-14 DIAGNOSIS — Z1159 Encounter for screening for other viral diseases: Secondary | ICD-10-CM

## 2016-04-14 DIAGNOSIS — E7849 Other hyperlipidemia: Secondary | ICD-10-CM

## 2016-04-14 DIAGNOSIS — F322 Major depressive disorder, single episode, severe without psychotic features: Secondary | ICD-10-CM | POA: Diagnosis not present

## 2016-04-14 DIAGNOSIS — Z0000 Encounter for general adult medical examination without abnormal findings: Secondary | ICD-10-CM | POA: Diagnosis not present

## 2016-04-14 DIAGNOSIS — M2042 Other hammer toe(s) (acquired), left foot: Secondary | ICD-10-CM | POA: Diagnosis not present

## 2016-04-14 LAB — CBC
HCT: 38.2 % (ref 35.0–45.0)
Hemoglobin: 12.5 g/dL (ref 11.7–15.5)
MCH: 30.9 pg (ref 27.0–33.0)
MCHC: 32.7 g/dL (ref 32.0–36.0)
MCV: 94.6 fL (ref 80.0–100.0)
MPV: 10.7 fL (ref 7.5–12.5)
Platelets: 278 10*3/uL (ref 140–400)
RBC: 4.04 MIL/uL (ref 3.80–5.10)
RDW: 14.3 % (ref 11.0–15.0)
WBC: 7.2 10*3/uL (ref 3.8–10.8)

## 2016-04-14 LAB — COMPLETE METABOLIC PANEL WITH GFR
ALBUMIN: 3.9 g/dL (ref 3.6–5.1)
ALT: 18 U/L (ref 6–29)
AST: 23 U/L (ref 10–35)
Alkaline Phosphatase: 54 U/L (ref 33–130)
BUN: 10 mg/dL (ref 7–25)
CALCIUM: 9.6 mg/dL (ref 8.6–10.4)
CHLORIDE: 102 mmol/L (ref 98–110)
CO2: 26 mmol/L (ref 20–31)
CREATININE: 0.82 mg/dL (ref 0.50–1.05)
GFR, Est African American: >89 mL/min (ref >=60)
GFR, Est Non African American: 80 mL/min (ref >=60)
Glucose, Bld: 209 mg/dL — ABNORMAL HIGH (ref 65–99)
Potassium: 4.3 mmol/L (ref 3.5–5.3)
Sodium: 138 mmol/L (ref 135–146)
Total Bilirubin: 0.4 mg/dL (ref 0.2–1.2)
Total Protein: 6.6 g/dL (ref 6.1–8.1)

## 2016-04-14 LAB — LIPID PANEL
Cholesterol: 255 mg/dL — ABNORMAL HIGH (ref <200)
HDL: 45 mg/dL — ABNORMAL LOW (ref >50)
LDL CALC: 152 mg/dL — AB (ref <100)
Total CHOL/HDL Ratio: 5.7 Ratio — ABNORMAL HIGH (ref <5.0)
Triglycerides: 290 mg/dL — ABNORMAL HIGH (ref <150)
VLDL: 58 mg/dL — AB (ref <30)

## 2016-04-14 LAB — POCT GLYCOSYLATED HEMOGLOBIN (HGB A1C): HEMOGLOBIN A1C: 10

## 2016-04-14 LAB — TSH: TSH: 1.74 mIU/L

## 2016-04-14 MED ORDER — ACCU-CHEK FASTCLIX LANCETS MISC: 1.0000 | Freq: Three times a day (TID) | 3 refills | Status: DC

## 2016-04-14 MED ORDER — OMEPRAZOLE 20 MG PO CPDR: 20.0000 mg | DELAYED_RELEASE_CAPSULE | Freq: Every day | ORAL | 1 refills | Status: DC | PRN

## 2016-04-14 MED ORDER — GLIPIZIDE 5 MG PO TABS: 5.0000 mg | ORAL_TABLET | Freq: Every day | ORAL | 3 refills | Status: DC

## 2016-04-14 MED ORDER — GLUCOSE BLOOD VI STRP: ORAL_STRIP | 3 refills | Status: DC

## 2016-04-14 MED ORDER — ATORVASTATIN CALCIUM 40 MG PO TABS: 40.0000 mg | ORAL_TABLET | Freq: Every day | ORAL | 3 refills | Status: DC

## 2016-04-14 MED ORDER — SERTRALINE HCL 50 MG PO TABS: 50.0000 mg | ORAL_TABLET | Freq: Every day | ORAL | 0 refills | Status: DC

## 2016-04-14 MED ORDER — ACCU-CHEK NANO SMARTVIEW W/DEVICE KIT: 1.0000 | PACK | Freq: Every day | 0 refills | Status: DC

## 2016-04-14 MED ORDER — ATENOLOL 25 MG PO TABS: 25.0000 mg | ORAL_TABLET | Freq: Every day | ORAL | 3 refills | Status: DC

## 2016-04-14 MED ORDER — METFORMIN HCL 1000 MG PO TABS: 1000.0000 mg | ORAL_TABLET | Freq: Two times a day (BID) | ORAL | 3 refills | Status: DC

## 2016-04-14 MED ORDER — LISINOPRIL 40 MG PO TABS: 40.0000 mg | ORAL_TABLET | Freq: Every day | ORAL | 3 refills | Status: DC

## 2016-04-14 NOTE — Patient Instructions (Addendum)
Thank you so much for coming to visit today! I'm sorry you are going through such a difficult time lately! I have sent a prescription for Zoloft to the pharmacy. You may take the prescribed dose for one week and if tolerated well, you may increase to two tablets a day until you return. I strongly encourage following up with Dr. Gwenlyn Saran either in person or on the phone.  If you have any thoughts about hurting yourself, pleas call 911 immediately. Please return in 2 weeks for a visit focused only on your depression and anxiety.   Your A1C is elevated today to 10. Please restart your Glipizide. Check your blood sugar 1-2 times daily, before breakfast and an hour after your largest meal of the day. Please write down your blood sugars and bring them to your next office visit.  We will check multiple labs today. We will mail you a letter with the results.  Referrals have been placed to Sports Medicine for Orthotics and to Dermatology for Psoriasis.   Dr. Gerlean Ren  Suicide Prevention Hotline: [phone]

## 2016-04-15 LAB — HIV ANTIBODY (ROUTINE TESTING W REFLEX): HIV: NONREACTIVE

## 2016-04-15 LAB — HEPATITIS C ANTIBODY: HCV Ab: NEGATIVE

## 2016-04-17 DIAGNOSIS — F32A Depression, unspecified: Secondary | ICD-10-CM | POA: Insufficient documentation

## 2016-04-17 DIAGNOSIS — F329 Major depressive disorder, single episode, unspecified: Secondary | ICD-10-CM | POA: Insufficient documentation

## 2016-04-17 NOTE — Assessment & Plan Note (Signed)
Referral to Dermatology

## 2016-04-17 NOTE — Assessment & Plan Note (Signed)
PHQ9 Score of 27. GAD7 Score of 20. Denies suicidal ideation at this time. Number for National Suicide Hotline given. Met with Behavioral Health to discuss returning for visits with them vs. Phone visits. Initiated on Zoloft with plans to titrate up after one week. Discussed need to schedule appointment just to discuss this concern in the coming weeks.

## 2016-04-17 NOTE — Assessment & Plan Note (Addendum)
No longer requires pap smears. Refusing Colonoscopy. Mammogram needed. Will check CMP, CBC, TSH, Lipid Panel. Also due for one time screening of HIV and Hepatitis C. Atorvastatin, Atenolol, Lisinopril refilled. Referral to Sports Medicine for evaluation for orthotics

## 2016-04-17 NOTE — Assessment & Plan Note (Signed)
Stable. Continue Lisinopril and Atenolol.
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
## 2016-04-17 NOTE — Assessment & Plan Note (Signed)
A1C 10 today. Recommended initiation of insulin, but Stacy Torres refuses. Continue Metformin and will restart Glipizide. Anticipate need for initiation of another agent as well. Prescription for glucometer given with recommendations to check blood sugar twice daily, once before breakfast and once after the largest meal of the day.

## 2016-04-18 ENCOUNTER — Telehealth: Payer: Self-pay | Admitting: Family Medicine

## 2016-04-18 NOTE — Telephone Encounter (Signed)
Was suppose to have hydrocortizone cream and ear drops called in at her visit 04-14-16.  They are not at the pharmacy.. [facility] [address]

## 2016-04-21 ENCOUNTER — Encounter: Payer: Self-pay | Admitting: Family Medicine

## 2016-04-21 MED ORDER — OFLOXACIN 0.3 % OP SOLN: 1.0000 [drp] | Freq: Four times a day (QID) | OPHTHALMIC | 0 refills | Status: DC

## 2016-04-21 MED ORDER — ZINC OXIDE 20 % EX OINT: 1.0000 "application " | TOPICAL_OINTMENT | Freq: Two times a day (BID) | CUTANEOUS | 3 refills | Status: DC

## 2016-05-01 ENCOUNTER — Ambulatory Visit: Payer: Medicaid Other | Admitting: Sports Medicine

## 2016-05-12 ENCOUNTER — Other Ambulatory Visit: Payer: Self-pay | Admitting: Family Medicine

## 2016-05-12 DIAGNOSIS — E119 Type 2 diabetes mellitus without complications: Secondary | ICD-10-CM

## 2016-05-12 MED ORDER — SERTRALINE HCL 100 MG PO TABS: 100.0000 mg | ORAL_TABLET | Freq: Every day | ORAL | 3 refills | Status: DC

## 2016-05-12 NOTE — Telephone Encounter (Signed)
Pt states PCP told her to call if pt felt she needed to increase the dose on Zoloft and pt would like to take it twice a day. [facility] [address] ep

## 2016-05-12 NOTE — Telephone Encounter (Signed)
Pt informed. Stacy Torres, Likisha Alles D, CMA  

## 2016-05-12 NOTE — Telephone Encounter (Signed)
I have increased the dose of Zoloft, but it is only a once daily medication.

## 2016-07-14 ENCOUNTER — Other Ambulatory Visit: Payer: Self-pay | Admitting: Family Medicine

## 2016-07-14 DIAGNOSIS — I10 Essential (primary) hypertension: Secondary | ICD-10-CM

## 2016-07-14 MED ORDER — LISINOPRIL 40 MG PO TABS: 40.0000 mg | ORAL_TABLET | Freq: Every day | ORAL | 3 refills | Status: DC

## 2016-07-14 NOTE — Telephone Encounter (Signed)
Pt  calling to request refill of:  Name of Medication(s):  Lisinopril  Last date of OV:  04-14-16 [facility] [place] [phone]  Will route refill request to Clinic RN.  Discussed with patient policy to call pharmacy for future refills.  Also, discussed refills may take up to 48 hours to approve or deny.  Renella Cunas

## 2016-11-02 ENCOUNTER — Other Ambulatory Visit: Payer: Self-pay | Admitting: *Deleted

## 2016-11-02 MED ORDER — SERTRALINE HCL 100 MG PO TABS
100.0000 mg | ORAL_TABLET | Freq: Every day | ORAL | 3 refills | Status: DC
Start: 2016-11-02 — End: 2018-10-10

## 2016-11-02 NOTE — Telephone Encounter (Signed)
Will refill this, but patient needs to be seen for f/u mood issues. PLease schedule w me or [facility].

## 2016-11-06 NOTE — Telephone Encounter (Signed)
LVM for pt to call back to inform her of below and assist her in scheduling an appointment. Katharina Caper, Khadeem Rockett D, [place]

## 2017-04-23 ENCOUNTER — Other Ambulatory Visit: Payer: Self-pay

## 2017-04-23 NOTE — Telephone Encounter (Signed)
LVM for pt to call the office. We received a refill request for Glipizide. The pharmacy requesting the refill isn't on her list. Please verify what pharmacy pt would like the medication to be sent to. [facility] [address] [place], [place] [facility] one [facility] sent the request. Ottis Stain, CMA

## 2017-04-24 ENCOUNTER — Other Ambulatory Visit: Payer: Self-pay | Admitting: *Deleted

## 2017-04-24 DIAGNOSIS — I10 Essential (primary) hypertension: Secondary | ICD-10-CM

## 2017-04-24 NOTE — Telephone Encounter (Signed)
Confirmed that the fax pharmacy was correct, sent this and one more request to PCP to be filled. Katharina Caper, April D, [place]

## 2017-04-26 MED ORDER — ATENOLOL 25 MG PO TABS: 25.0000 mg | ORAL_TABLET | Freq: Every day | ORAL | 0 refills | Status: DC

## 2017-04-26 MED ORDER — GLIPIZIDE 5 MG PO TABS: 5.0000 mg | ORAL_TABLET | Freq: Every day | ORAL | 0 refills | Status: DC

## 2017-04-26 NOTE — Telephone Encounter (Signed)
Refilled for one 90d supply. Patient not seen in 1 year. Needs appt for HTN and DM as soon as possible. Please call and help her schedule this.

## 2017-05-01 NOTE — Telephone Encounter (Signed)
LVM for pt to call office back to give her the below information and assist her in scheduling an appointment. Stacy Torres, Stacy Torres, [place]

## 2017-05-14 ENCOUNTER — Other Ambulatory Visit: Payer: Self-pay | Admitting: *Deleted

## 2017-05-14 DIAGNOSIS — K219 Gastro-esophageal reflux disease without esophagitis: Secondary | ICD-10-CM

## 2017-05-14 MED ORDER — OMEPRAZOLE 20 MG PO CPDR: 20.0000 mg | DELAYED_RELEASE_CAPSULE | Freq: Every day | ORAL | 0 refills | Status: DC | PRN

## 2017-06-28 ENCOUNTER — Other Ambulatory Visit: Payer: Self-pay

## 2017-06-28 DIAGNOSIS — E7849 Other hyperlipidemia: Secondary | ICD-10-CM

## 2017-06-28 NOTE — Telephone Encounter (Signed)
lmovm for pt to return call. Fleeger, Jessica Dawn, CMA  

## 2017-06-28 NOTE — Telephone Encounter (Signed)
Pharmacy is reaching out to provider in regards to pts statin therapy. Pharmacy noticed pts statin has not been filled recently and would like to know if this is appropriate? If not, medication has been pended.

## 2017-06-28 NOTE — Telephone Encounter (Signed)
Will defer statin to next visit in order to counsel patient. Patient needs to be seen for physical, please call to help her schedule this.

## 2017-07-24 ENCOUNTER — Other Ambulatory Visit: Payer: Self-pay | Admitting: Family Medicine

## 2017-07-24 ENCOUNTER — Other Ambulatory Visit: Payer: Self-pay

## 2017-07-24 DIAGNOSIS — E119 Type 2 diabetes mellitus without complications: Secondary | ICD-10-CM

## 2017-07-24 DIAGNOSIS — I10 Essential (primary) hypertension: Secondary | ICD-10-CM

## 2017-07-24 NOTE — Telephone Encounter (Signed)
Please call patient and get her an appt with me (I have a same day tomorrow). She has not been seen in >15 months and needs a visit. If she can set a visit I can send a metformin refill to get her to the visit.

## 2017-07-24 NOTE — Telephone Encounter (Signed)
LVM (general) for a return call to schedule an appt. Ottis Stain, CMA

## 2017-07-25 MED ORDER — METFORMIN HCL 1000 MG PO TABS: 1000.0000 mg | ORAL_TABLET | Freq: Two times a day (BID) | ORAL | 0 refills | Status: DC

## 2017-07-25 NOTE — Telephone Encounter (Signed)
I need to know patient's kidney function before I refill her metformin. Please call her and have her come in to get blood drawn before her 5/17 appt. Sending refills of metformin and atenolol now.

## 2017-07-25 NOTE — Telephone Encounter (Signed)
Contacted pt, she is agreeable to plan but will call back before the end of this week to schedule an appt (transportation). [name], Salome Spotted, CMA

## 2017-07-25 NOTE — Telephone Encounter (Signed)
Spoke with provider.  She is ok with pt having an appt on 08/17/17, but would like for her to additionally come in for a lab visit before her appt and also before she will send a refill.     Contacted pt, she is agreeable to plan but will call back before the end of this week to schedule an appt (transportation). Fleeger, Salome Spotted, CMA

## 2017-08-02 ENCOUNTER — Other Ambulatory Visit: Payer: Self-pay | Admitting: Family Medicine

## 2017-08-17 ENCOUNTER — Encounter: Payer: Self-pay | Admitting: Family Medicine

## 2017-12-25 ENCOUNTER — Encounter: Payer: Self-pay | Admitting: Family Medicine

## 2018-06-28 ENCOUNTER — Ambulatory Visit: Payer: Self-pay | Admitting: Family Medicine

## 2018-07-05 ENCOUNTER — Ambulatory Visit: Payer: Self-pay | Admitting: Family Medicine

## 2018-07-08 ENCOUNTER — Ambulatory Visit: Payer: Self-pay | Admitting: Family Medicine

## 2018-10-09 ENCOUNTER — Telehealth: Payer: Self-pay

## 2018-10-09 NOTE — Telephone Encounter (Signed)
Ms. Stacy Torres is found to come in we will screen and check her temperature before she is allowed to come to the back to the clinical areas.

## 2018-10-09 NOTE — Telephone Encounter (Signed)
Called patient to do their pre-visit COVID screening.  Have you been tested for COVID or are you currently waiting for COVID test results? no  Have you recently traveled internationally([place], [place], [place], [place], [place]) or within the [place] to a hotspot area([place], [place], [place], [place], [place])? no  Are you currently experiencing any of the following: fever, cough, SHOB, fatigue, body aches, loss of smell, rash, diarrhea, vomiting, severe headaches, weakness, sore throat? Patient has c/o of subjective fever. No thermometer to check.  Have you been in contact with anyone who has recently travelled? no  Have you been in contact with anyone who is experiencing any of the above symptoms or been diagnosed with [place]  or works in or has recently visited a SNF? no

## 2018-10-10 ENCOUNTER — Ambulatory Visit (INDEPENDENT_AMBULATORY_CARE_PROVIDER_SITE_OTHER): Payer: Medicaid Other | Admitting: Family Medicine

## 2018-10-10 ENCOUNTER — Other Ambulatory Visit: Payer: Self-pay

## 2018-10-10 ENCOUNTER — Ambulatory Visit: Payer: Self-pay | Admitting: Family Medicine

## 2018-10-10 ENCOUNTER — Encounter: Payer: Self-pay | Admitting: Family Medicine

## 2018-10-10 VITALS — BP 144/76 | HR 108 | Temp 97.5°F | Resp 17 | Ht 63.0 in | Wt 169.2 lb

## 2018-10-10 DIAGNOSIS — K219 Gastro-esophageal reflux disease without esophagitis: Secondary | ICD-10-CM

## 2018-10-10 DIAGNOSIS — F331 Major depressive disorder, recurrent, moderate: Secondary | ICD-10-CM

## 2018-10-10 DIAGNOSIS — E1165 Type 2 diabetes mellitus with hyperglycemia: Secondary | ICD-10-CM

## 2018-10-10 DIAGNOSIS — I10 Essential (primary) hypertension: Secondary | ICD-10-CM

## 2018-10-10 DIAGNOSIS — R8290 Unspecified abnormal findings in urine: Secondary | ICD-10-CM | POA: Diagnosis not present

## 2018-10-10 DIAGNOSIS — E7849 Other hyperlipidemia: Secondary | ICD-10-CM | POA: Diagnosis not present

## 2018-10-10 DIAGNOSIS — Z1389 Encounter for screening for other disorder: Secondary | ICD-10-CM

## 2018-10-10 DIAGNOSIS — E119 Type 2 diabetes mellitus without complications: Secondary | ICD-10-CM

## 2018-10-10 DIAGNOSIS — Z1329 Encounter for screening for other suspected endocrine disorder: Secondary | ICD-10-CM

## 2018-10-10 LAB — POCT URINALYSIS DIP (CLINITEK)
Bilirubin, UA: NEGATIVE
Glucose, UA: 100 mg/dL — AB
Ketones, POC UA: NEGATIVE mg/dL
Nitrite, UA: NEGATIVE
POC PROTEIN,UA: NEGATIVE
Spec Grav, UA: 1.01 (ref 1.010–1.025)
Urobilinogen, UA: 0.2 E.U./dL
pH, UA: 5.5 (ref 5.0–8.0)

## 2018-10-10 MED ORDER — SERTRALINE HCL 100 MG PO TABS: 100.0000 mg | ORAL_TABLET | Freq: Every day | ORAL | 2 refills | Status: DC

## 2018-10-10 MED ORDER — METFORMIN HCL 1000 MG PO TABS: 1000.0000 mg | ORAL_TABLET | Freq: Two times a day (BID) | ORAL | 2 refills | Status: DC

## 2018-10-10 MED ORDER — BLOOD GLUCOSE MONITOR KIT: PACK | 0 refills | Status: AC

## 2018-10-10 MED ORDER — OMEPRAZOLE 20 MG PO CPDR: 20.0000 mg | DELAYED_RELEASE_CAPSULE | Freq: Every day | ORAL | 2 refills | Status: DC | PRN

## 2018-10-10 MED ORDER — GLIPIZIDE 5 MG PO TABS: 5.0000 mg | ORAL_TABLET | Freq: Every day | ORAL | 2 refills | Status: DC

## 2018-10-10 MED ORDER — ATENOLOL 25 MG PO TABS: 25.0000 mg | ORAL_TABLET | Freq: Every day | ORAL | 3 refills | Status: DC

## 2018-10-10 MED ORDER — ATORVASTATIN CALCIUM 10 MG PO TABS: 10.0000 mg | ORAL_TABLET | Freq: Every day | ORAL | 2 refills | Status: DC

## 2018-10-10 MED ORDER — LISINOPRIL 40 MG PO TABS: 40.0000 mg | ORAL_TABLET | Freq: Every day | ORAL | 3 refills | Status: DC

## 2018-10-10 MED ORDER — FLUTICASONE PROPIONATE 50 MCG/ACT NA SUSP: 2.0000 | Freq: Every day | NASAL | 12 refills | Status: DC

## 2018-10-10 NOTE — Progress Notes (Signed)
Patient ID: Stacy Torres, female    DOB: 03-04-1961, 58 y.o.   MRN: [id number]  PCP: Scot Jun, FNP  Chief Complaint  Patient presents with  . Establish Care  . Diabetes  . Hypertension  . Hyperlipidemia    Subjective:  HPI Stacy Torres is a 58 y.o. female presents to establish care and evaluation of type 2 diabetes, hypertension, and hyperlipidemia.  Stacy Torres has DM (diabetes mellitus) ([place]); HTN (hypertension); Hyperlipidemia; GERD (gastroesophageal reflux disease); Obesity (BMI 30-39.9); Psoriasis; Health care maintenance; and Depression on their problem list.   Diabetes  Numbness and tingling in feet    Hypertension  Stacy Torres reports no home monitoring of blood pressure.  Reports adherence to blood pressure medications. Reports efforts to adhere to low sodium diet. *He/She is a nonsmoker. Denies any episodes of dizziness, no ed headaches, shortness of breath, or chest pain.   GERD Omeprazole   Social History   Socioeconomic History  . Marital status: Married    Spouse name: Not on file  . Number of children: Not on file  . Years of education: Not on file  . Highest education level: Not on file  Occupational History  . Not on file  Social Needs  . Financial resource strain: Not on file  . Food insecurity    Worry: Not on file    Inability: Not on file  . Transportation needs    Medical: Not on file    Non-medical: Not on file  Tobacco Use  . Smoking status: Never Smoker  . Smokeless tobacco: Never Used  Substance and Sexual Activity  . Alcohol use: No  . Drug use: No  . Sexual activity: Not Currently  Lifestyle  . Physical activity    Days per week: Not on file    Minutes per session: Not on file  . Stress: Not on file  Relationships  . Social Herbalist on phone: Not on file    Gets together: Not on file    Attends religious service: Not on file    Active member of club or organization: Not on  file    Attends meetings of clubs or organizations: Not on file    Relationship status: Not on file  . Intimate partner violence    Fear of current or ex partner: Not on file    Emotionally abused: Not on file    Physically abused: Not on file    Forced sexual activity: Not on file  Other Topics Concern  . Not on file  Social History Narrative  . Not on file    No family history on file.  Review of Systems Pertinent negatives listed in HPI No Known Allergies  Prior to Admission medications   Medication Sig Start Date End Date Taking? Authorizing Provider  atenolol (TENORMIN) 25 MG tablet TAKE 1 TABLET BY MOUTH EVERY DAY 07/25/17   Sela Hilding, MD  atorvastatin (LIPITOR) 40 MG tablet Take 1 tablet (40 mg total) by mouth daily. Patient not taking: Reported on 10/10/2018 04/14/16   Rumley, Burna Cash, DO  glipiZIDE (GLUCOTROL) 5 MG tablet TAKE 1 TABLET (5 MG TOTAL) BY MOUTH DAILY BEFORE BREAKFAST. 08/02/17   Sela Hilding, MD  lisinopril (PRINIVIL,ZESTRIL) 40 MG tablet Take 1 tablet (40 mg total) by mouth daily. 07/14/16   Rumley, Burna Cash, DO  metFORMIN (GLUCOPHAGE) 1000 MG tablet Take 1 tablet (1,000 mg total) by mouth 2 (two) times daily with a meal.  07/25/17   Sela Hilding, MD  omeprazole (PRILOSEC) 20 MG capsule Take 1 capsule (20 mg total) by mouth daily as needed. 05/14/17   Sela Hilding, MD  sertraline (ZOLOFT) 100 MG tablet Take 1 tablet (100 mg total) by mouth daily. 11/02/16   Sela Hilding, MD    Past Medical, Surgical Family and Social History reviewed and updated.    Objective:   Today's Vitals   10/10/18 1554  BP: (!) 144/76  Pulse: (!) 108  Resp: 17  Temp: (!) 97.5 F (36.4 C)  TempSrc: Temporal  SpO2: 97%  Weight: 169 lb 3.2 oz (76.7 kg)  Height: 5\' 3"  (1.6 m)    BP Readings from Last 3 Encounters:  10/10/18 (!) 144/76  04/14/16 132/74  04/07/15 140/69    Filed Weights   10/10/18 1554  Weight: 169 lb 3.2 oz (76.7 kg)      Physical Exam General appearance: alert, well developed, well nourished, cooperative and in no distress Head: Normocephalic, without obvious abnormality, atraumatic Respiratory: Respirations even and unlabored, normal respiratory rate Heart: rate and rhythm normal. No gallop or murmurs noted on exam  Abdomen: BS +, no distention, no rebound tenderness, or no mass Extremities: No gross deformities Skin: Skin color, texture, turgor normal. No rashes seen  Psych: Appropriate mood and affect. Neurologic: Mental status: Alert, oriented to person, place, and time, thought content appropriate.  Lab Results  Component Value Date   HGBA1C 10.0 04/14/2016      Assessment & Plan:  1. Type 2 diabetes mellitus with hyperglycemia, unspecified whether long term insulin use ([place]), uncertain of prior O7H  Will continue current metformin dose and glipizide. - Hemoglobin A1c - Comprehensive metabolic panel - Microalbumin/Creatinine Ratio, Urine  2. Screening for thyroid disorder - CBC with Differential - Thyroid Panel With TSH  3. Screening for blood or protein in urine - POCT URINALYSIS DIP (CLIN[facility]K)  4. Essential hypertension, elevated today -Continue current medication We have discussed target BP range and blood pressure goal. I have advised patient to check BP regularly and to call us back or report to clinic if the numbers are consistently higher than 140/90. We discussed the importance of compliance with medical therapy and DASH diet recommended, consequences of uncontrolled hypertension discussed.  - atenolol (TENORMIN) 25 MG tablet; Take 1 tablet (25 mg total) by mouth daily. - lisinopril (ZESTRIL) 40 MG tablet; Take 1 tablet (40 mg total) by mouth daily.    5. Other hyperlipidemia - atorvastatin (LIPITOR) 10 MG tablet; Take 1 tablet (10 mg total) by mouth daily.  Dispense: 90 tablet; Refill: 2  6. Gastroesophageal reflux disease without esophagitis - omeprazole (PRILOSEC) 20 MG  capsule; Take 1 capsule (20 mg total) by mouth daily as needed.  Dispense: 90 capsule; Refill: 2  7. 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 Abnormal urinalysis - Urine Culture   RTC: 3 months for Hypertension and Diabetes follow-up    Molli Barrows, FNP [facility] [address] [place] [phone]: [phone]

## 2018-10-10 NOTE — Patient Instructions (Addendum)
Thank you for choosing Primary Care at [facility] to be your medical home!    Stacy Torres was seen by Molli Barrows, FNP today.   Driscilla Moats Rueda's primary care provider is Scot Jun, FNP.   For the best care possible, you should try to see Molli Barrows, FNP-C whenever you come to the clinic.   We look forward to seeing you again soon!  If you have any questions about your visit today, please call us at [phone] or feel free to reach your primary care provider via [place].      Eustachian Tube Dysfunction  Eustachian tube dysfunction refers to a condition in which a blockage develops in the narrow passage that connects the middle ear to the back of the nose (eustachian tube). The eustachian tube regulates air pressure in the middle ear by letting air move between the ear and nose. It also helps to drain fluid from the middle ear space. Eustachian tube dysfunction can affect one or both ears. When the eustachian tube does not function properly, air pressure, fluid, or both can build up in the middle ear. What are the causes? This condition occurs when the eustachian tube becomes blocked or cannot open normally. Common causes of this condition include:  Ear infections.  Colds and other infections that affect the nose, mouth, and throat (upper respiratory tract).  Allergies.  Irritation from cigarette smoke.  Irritation from stomach acid coming up into the esophagus (gastroesophageal reflux). The esophagus is the tube that carries food from the mouth to the stomach.  Sudden changes in air pressure, such as from descending in an airplane or scuba diving.  Abnormal growths in the nose or throat, such as: ? Growths that line the nose (nasal polyps). ? Abnormal growth of cells (tumors). ? Enlarged tissue at the back of the throat (adenoids). What increases the risk? You are more likely to develop this condition if:  You smoke.  You are  overweight.  You are a child who has: ? Certain birth defects of the mouth, such as cleft palate. ? Large tonsils or adenoids. What are the signs or symptoms? Common symptoms of this condition include:  A feeling of fullness in the ear.  Ear pain.  Clicking or popping noises in the ear.  Ringing in the ear.  Hearing loss.  Loss of balance.  Dizziness. Symptoms may get worse when the air pressure around you changes, such as when you travel to an area of high elevation, fly on an airplane, or go scuba diving. How is this diagnosed? This condition may be diagnosed based on:  Your symptoms.  A physical exam of your ears, nose, and throat.  Tests, such as those that measure: ? The movement of your eardrum (tympanogram). ? Your hearing (audiometry). How is this treated? Treatment depends on the cause and severity of your condition.  In mild cases, you may relieve your symptoms by moving air into your ears. This is called "popping the ears."  In more severe cases, or if you have symptoms of fluid in your ears, treatment may include: ? Medicines to relieve congestion (decongestants). ? Medicines that treat allergies (antihistamines). ? Nasal sprays or ear drops that contain medicines that reduce swelling (steroids). ? A procedure to drain the fluid in your eardrum (myringotomy). In this procedure, a small tube is placed in the eardrum to:  Drain the fluid.  Restore the air in the middle ear space. ? A procedure to insert a balloon  device through the nose to inflate the opening of the eustachian tube (balloon dilation). Follow these instructions at home: Lifestyle  Do not do any of the following until your health care provider approves: ? Travel to high altitudes. ? Fly in airplanes. ? Work in a Pension scheme manager or room. ? Scuba dive.  Do not use any products that contain nicotine or tobacco, such as cigarettes and e-cigarettes. If you need help quitting, ask your  health care provider.  Keep your ears dry. Wear fitted earplugs during showering and bathing. Dry your ears completely after. General instructions  Take over-the-counter and prescription medicines only as told by your health care provider.  Use techniques to help pop your ears as recommended by your health care provider. These may include: ? Chewing gum. ? Yawning. ? Frequent, forceful swallowing. ? Closing your mouth, holding your nose closed, and gently blowing as if you are trying to blow air out of your nose.  Keep all follow-up visits as told by your health care provider. This is important. Contact a health care provider if:  Your symptoms do not go away after treatment.  Your symptoms come back after treatment.  You are unable to pop your ears.  You have: ? A fever. ? Pain in your ear. ? Pain in your head or neck. ? Fluid draining from your ear.  Your hearing suddenly changes.  You become very dizzy.  You lose your balance. Summary  Eustachian tube dysfunction refers to a condition in which a blockage develops in the eustachian tube.  It can be caused by ear infections, allergies, inhaled irritants, or abnormal growths in the nose or throat.  Symptoms include ear pain, hearing loss, or ringing in the ears.  Mild cases are treated with maneuvers to unblock the ears, such as yawning or ear popping.  Severe cases are treated with medicines. Surgery may also be done (rare). This information is not intended to replace advice given to you by your health care provider. Make sure you discuss any questions you have with your health care provider. Document Released: 04/16/2015 Document Revised: 07/10/2017 Document Reviewed: 07/10/2017 Elsevier Patient Education  2020 [employer].

## 2018-10-11 ENCOUNTER — Telehealth: Payer: Self-pay | Admitting: Family Medicine

## 2018-10-11 LAB — THYROID PANEL WITH TSH
Free Thyroxine Index: 1.5 (ref 1.2–4.9)
T3 Uptake Ratio: 23 % — ABNORMAL LOW (ref 24–39)
T4, Total: 6.6 ug/dL (ref 4.5–12.0)
TSH: 2.62 u[IU]/mL (ref 0.450–4.500)

## 2018-10-11 LAB — CBC WITH DIFFERENTIAL/PLATELET
Basophils Absolute: 0.1 10*3/uL (ref 0.0–0.2)
Basos: 1 %
EOS (ABSOLUTE): 0.3 10*3/uL (ref 0.0–0.4)
Eos: 2 %
Hematocrit: 32.2 % — ABNORMAL LOW (ref 34.0–46.6)
Hemoglobin: 10 g/dL — ABNORMAL LOW (ref 11.1–15.9)
Immature Grans (Abs): 0.1 10*3/uL (ref 0.0–0.1)
Immature Granulocytes: 1 %
Lymphocytes Absolute: 2 10*3/uL (ref 0.7–3.1)
Lymphs: 17 %
MCH: 26.8 pg (ref 26.6–33.0)
MCHC: 31.1 g/dL — ABNORMAL LOW (ref 31.5–35.7)
MCV: 86 fL (ref 79–97)
Monocytes Absolute: 0.7 10*3/uL (ref 0.1–0.9)
Monocytes: 6 %
Neutrophils Absolute: 8.8 10*3/uL — ABNORMAL HIGH (ref 1.4–7.0)
Neutrophils: 73 %
Platelets: 385 10*3/uL (ref 150–450)
RBC: 3.73 x10E6/uL — ABNORMAL LOW (ref 3.77–5.28)
RDW: 17.8 % — ABNORMAL HIGH (ref 11.7–15.4)
WBC: 11.9 10*3/uL — ABNORMAL HIGH (ref 3.4–10.8)

## 2018-10-11 LAB — COMPREHENSIVE METABOLIC PANEL
ALT: 10 IU/L (ref 0–32)
AST: 14 IU/L (ref 0–40)
Albumin/Globulin Ratio: 1.9 (ref 1.2–2.2)
Albumin: 4.5 g/dL (ref 3.8–4.9)
Alkaline Phosphatase: 66 IU/L (ref 39–117)
BUN/Creatinine Ratio: 17 (ref 12–28)
BUN: 25 mg/dL (ref 8–27)
Bilirubin Total: <0.2 mg/dL (ref 0.0–1.2)
CO2: 16 mmol/L — ABNORMAL LOW (ref 20–29)
Calcium: 10 mg/dL (ref 8.7–10.3)
Chloride: 100 mmol/L (ref 96–106)
Creatinine, Ser: 1.47 mg/dL — ABNORMAL HIGH (ref 0.57–1.00)
GFR calc Af Amer: 44 mL/min/{1.73_m2} — ABNORMAL LOW (ref >59)
GFR calc non Af Amer: 39 mL/min/{1.73_m2} — ABNORMAL LOW (ref >59)
Globulin, Total: 2.4 g/dL (ref 1.5–4.5)
Glucose: 226 mg/dL — ABNORMAL HIGH (ref 65–99)
Potassium: 4.5 mmol/L (ref 3.5–5.2)
Sodium: 134 mmol/L (ref 134–144)
Total Protein: 6.9 g/dL (ref 6.0–8.5)

## 2018-10-11 LAB — HEMOGLOBIN A1C
Est. average glucose Bld gHb Est-mCnc: 160 mg/dL
Hgb A1c MFr Bld: 7.2 % — ABNORMAL HIGH (ref 4.8–5.6)

## 2018-10-11 LAB — MICROALBUMIN / CREATININE URINE RATIO
Creatinine, Urine: 49.9 mg/dL
Microalb/Creat Ratio: 7 mg/g creat (ref 0–29)
Microalbumin, Urine: 3.6 ug/mL

## 2018-10-11 NOTE — Telephone Encounter (Signed)
Positive depression screen with #9 positive without a plan for suicide. Not interested in counseling right now, however, agreed to allow me to pass her information along for you to complete a follow-up phone call advises of the integrative services offered here at our clinic. History of depression and currently on Zoloft for several years.

## 2018-10-12 LAB — URINE CULTURE

## 2018-10-14 NOTE — Progress Notes (Signed)
Patient notified of results & recommendations. Expressed understanding.

## 2018-10-29 ENCOUNTER — Telehealth: Payer: Self-pay | Admitting: Licensed Clinical Social Worker

## 2018-10-29 NOTE — Telephone Encounter (Signed)
Call placed to patient. LCSW introduced self and explained role at [facility]. Pt was informed of consult to address depression and anxiety symptoms from previous appointment with PCP.   Pt shared that she recently ended a relationship of 42 years resulting in an increase in depression and anxiety. She has been participating in medication management and spending more time with adult daughter and grandchildren. Pt states she feels, "a lot better" LCSW provided validation and encouragement. Healthy coping skills were discussed.   Pt was strongly encouraged to contact clinic should any need arise.

## 2018-11-14 ENCOUNTER — Other Ambulatory Visit: Payer: Self-pay | Admitting: Family Medicine

## 2018-11-14 NOTE — Telephone Encounter (Signed)
Please advise 

## 2019-02-19 DIAGNOSIS — Z1159 Encounter for screening for other viral diseases: Secondary | ICD-10-CM | POA: Diagnosis not present

## 2019-02-19 DIAGNOSIS — Z03818 Encounter for observation for suspected exposure to other biological agents ruled out: Secondary | ICD-10-CM | POA: Diagnosis not present

## 2019-05-23 DIAGNOSIS — R062 Wheezing: Secondary | ICD-10-CM | POA: Diagnosis not present

## 2019-05-23 DIAGNOSIS — R509 Fever, unspecified: Secondary | ICD-10-CM | POA: Diagnosis not present

## 2019-05-23 DIAGNOSIS — Z1159 Encounter for screening for other viral diseases: Secondary | ICD-10-CM | POA: Diagnosis not present

## 2019-05-23 DIAGNOSIS — Z03818 Encounter for observation for suspected exposure to other biological agents ruled out: Secondary | ICD-10-CM | POA: Diagnosis not present

## 2019-09-05 ENCOUNTER — Other Ambulatory Visit: Payer: Self-pay | Admitting: Family Medicine

## 2019-09-05 NOTE — Telephone Encounter (Signed)
Request for Rx refill- sent for review

## 2019-09-11 ENCOUNTER — Other Ambulatory Visit: Payer: Self-pay | Admitting: Family Medicine

## 2019-09-11 NOTE — Telephone Encounter (Signed)
Request for Rx refill- sent for review

## 2019-09-19 ENCOUNTER — Telehealth: Payer: Self-pay | Admitting: Family Medicine

## 2019-09-19 DIAGNOSIS — E119 Type 2 diabetes mellitus without complications: Secondary | ICD-10-CM

## 2019-09-19 DIAGNOSIS — E7849 Other hyperlipidemia: Secondary | ICD-10-CM

## 2019-09-19 DIAGNOSIS — K219 Gastro-esophageal reflux disease without esophagitis: Secondary | ICD-10-CM

## 2019-09-19 DIAGNOSIS — I10 Essential (primary) hypertension: Secondary | ICD-10-CM

## 2019-09-19 NOTE — Telephone Encounter (Signed)
1) Medication(s) Requested (by name): PT NEEDS ALL MEDS   2) Pharmacy [facility] Choice:[id number] [place], [place] [place] [address]   3) Special Requests: MADE AN APPOINTMENT    Approved medications will be sent to the pharmacy, we will reach out if there is an issue.  Requests made after 3pm may not be addressed until the following business day!  If a patient is unsure of the name of the medication(s) please note and ask patient to call back when they are able to provide all info, do not send to responsible party until all information is available!

## 2019-09-22 MED ORDER — SERTRALINE HCL 100 MG PO TABS: 100.0000 mg | ORAL_TABLET | Freq: Every day | ORAL | 0 refills | Status: DC

## 2019-09-22 MED ORDER — ATORVASTATIN CALCIUM 10 MG PO TABS: 10.0000 mg | ORAL_TABLET | Freq: Every day | ORAL | 0 refills | Status: DC

## 2019-09-22 MED ORDER — METFORMIN HCL 1000 MG PO TABS: 1000.0000 mg | ORAL_TABLET | Freq: Two times a day (BID) | ORAL | 0 refills | Status: DC

## 2019-09-22 MED ORDER — OMEPRAZOLE 20 MG PO CPDR: 20.0000 mg | DELAYED_RELEASE_CAPSULE | Freq: Every day | ORAL | 0 refills | Status: DC | PRN

## 2019-09-22 MED ORDER — GLIPIZIDE 5 MG PO TABS: 5.0000 mg | ORAL_TABLET | Freq: Every day | ORAL | 0 refills | Status: DC

## 2019-09-22 MED ORDER — ATENOLOL 25 MG PO TABS: 25.0000 mg | ORAL_TABLET | Freq: Every day | ORAL | 0 refills | Status: DC

## 2019-09-22 MED ORDER — LISINOPRIL 40 MG PO TABS: 40.0000 mg | ORAL_TABLET | Freq: Every day | ORAL | 0 refills | Status: DC

## 2019-09-22 NOTE — Telephone Encounter (Signed)
Short Rx sent. 

## 2019-10-09 ENCOUNTER — Ambulatory Visit: Payer: Medicaid Other | Admitting: Internal Medicine

## 2019-10-22 ENCOUNTER — Other Ambulatory Visit: Payer: Self-pay | Admitting: Family Medicine

## 2019-10-22 NOTE — Telephone Encounter (Signed)
She can be put on the schedule as a virtual visit tomorrow. She already had a short fill & was supposed to be seen on 10/09/2019 but cancelled the appointment.

## 2019-10-22 NOTE — Telephone Encounter (Signed)
Will also need a fasting lab appointment.

## 2019-10-22 NOTE — Telephone Encounter (Signed)
Pt called asking for a short fill until she has her appointment on all medication

## 2019-10-23 ENCOUNTER — Telehealth (INDEPENDENT_AMBULATORY_CARE_PROVIDER_SITE_OTHER): Payer: Medicaid Other | Admitting: Internal Medicine

## 2019-10-23 DIAGNOSIS — Z1231 Encounter for screening mammogram for malignant neoplasm of breast: Secondary | ICD-10-CM | POA: Diagnosis not present

## 2019-10-23 DIAGNOSIS — I10 Essential (primary) hypertension: Secondary | ICD-10-CM

## 2019-10-23 DIAGNOSIS — E785 Hyperlipidemia, unspecified: Secondary | ICD-10-CM

## 2019-10-23 DIAGNOSIS — Z1211 Encounter for screening for malignant neoplasm of colon: Secondary | ICD-10-CM

## 2019-10-23 DIAGNOSIS — K219 Gastro-esophageal reflux disease without esophagitis: Secondary | ICD-10-CM

## 2019-10-23 DIAGNOSIS — E119 Type 2 diabetes mellitus without complications: Secondary | ICD-10-CM | POA: Diagnosis not present

## 2019-10-23 MED ORDER — OMEPRAZOLE 20 MG PO CPDR: 20.0000 mg | DELAYED_RELEASE_CAPSULE | Freq: Every day | ORAL | 0 refills | Status: DC | PRN

## 2019-10-23 MED ORDER — GLIPIZIDE 5 MG PO TABS: 5.0000 mg | ORAL_TABLET | Freq: Every day | ORAL | 0 refills | Status: DC

## 2019-10-23 MED ORDER — LISINOPRIL 40 MG PO TABS: 40.0000 mg | ORAL_TABLET | Freq: Every day | ORAL | 0 refills | Status: DC

## 2019-10-23 MED ORDER — METFORMIN HCL 1000 MG PO TABS: 1000.0000 mg | ORAL_TABLET | Freq: Two times a day (BID) | ORAL | 0 refills | Status: DC

## 2019-10-23 NOTE — Progress Notes (Signed)
Virtual Visit via Telephone Note  I connected with Stacy Torres, on 10/23/2019 at 9:06 AM by telephone due to the COVID-19 pandemic and verified that I am speaking with the correct person using two identifiers.   Consent: I discussed the limitations, risks, security and privacy concerns of performing an evaluation and management service by telephone and the availability of in person appointments. I also discussed with the patient that there may be a patient responsible charge related to this service. The patient expressed understanding and agreed to proceed.   Location of Patient: Home   Location of Provider: Clinic    Persons participating in Telemedicine visit: [facility] [place] Dr. Juleen China      History of Present Illness: Patient has a visit to follow up on chronic medical conditions.   Diabetes mellitus, Type 2 Disease Monitoring             Blood Sugar Ranges: Does not check.              Polyuria: no              Visual problems: no   Urine Microalbumin 7 (July 2020) ---on Ace inhibitor   Last A1C: 7.2 (July 2020)   Medications: Metformin 1000 mg BID, Glipizide 5 mg  Medication Compliance: yes  Medication Side Effects             Hypoglycemia: no   Chronic HTN Disease Monitoring:  Home BP Monitoring - Does not monitor  Chest pain- no  Dyspnea- no Headache - no  Medications: Lisinopril 40 mg  Compliance- yes Lightheadedness- no  Edema- no   Past Medical History:  Diagnosis Date  . Anxiety    Phreesia 10/23/2019  . Arthritis    Phreesia 10/23/2019  . Diabetes mellitus without complication ([place])   . Hyperlipidemia    Phreesia 10/23/2019  . Hypertension    No Known Allergies  Current Outpatient Medications on File Prior to Visit  Medication Sig Dispense Refill  . atenolol (TENORMIN) 25 MG tablet Take 1 tablet (25 mg total) by mouth daily. 30 tablet 0  . atorvastatin (LIPITOR) 10 MG tablet Take 1 tablet (10 mg  total) by mouth daily. 30 tablet 0  . blood glucose meter kit and supplies KIT Dispense based on patient and insurance preference. Use up to BID  times daily as directed to check blood sugar. E11.89 1 each 0  . fluticasone (FLONASE) 50 MCG/ACT nasal spray Place 2 sprays into both nostrils daily. 16 g 12  . glipiZIDE (GLUCOTROL) 5 MG tablet Take 1 tablet (5 mg total) by mouth daily before breakfast. 30 tablet 0  . lisinopril (ZESTRIL) 40 MG tablet Take 1 tablet (40 mg total) by mouth daily. 30 tablet 0  . metFORMIN (GLUCOPHAGE) 1000 MG tablet Take 1 tablet (1,000 mg total) by mouth 2 (two) times daily with a meal. 60 tablet 0  . omeprazole (PRILOSEC) 20 MG capsule Take 1 capsule (20 mg total) by mouth daily as needed. 30 capsule 0  . sertraline (ZOLOFT) 100 MG tablet Take 1 tablet (100 mg total) by mouth daily. 30 tablet 0   No current facility-administered medications on file prior to visit.    Observations/Objective: NAD. Speaking clearly.  Work of breathing normal.  Alert and oriented. Mood appropriate.   Assessment and Plan: 1. Essential hypertension Asymptomatic. Continue current regimen. Monitor at future in person visits.  - lisinopril (ZESTRIL) 40 MG tablet; Take 1 tablet (40 mg total) by  mouth daily.  Dispense: 30 tablet; Refill: 0 - Comprehensive metabolic panel; Future  2. Type 2 diabetes mellitus without complication, without long-term current use of insulin (HCC) Need to obtain A1c as last one was >1 year ago and patient does not monitor CBGs at home. Continue Glipizide and Metformin pending results.  - glipiZIDE (GLUCOTROL) 5 MG tablet; Take 1 tablet (5 mg total) by mouth daily before breakfast.  Dispense: 30 tablet; Refill: 0 - metFORMIN (GLUCOPHAGE) 1000 MG tablet; Take 1 tablet (1,000 mg total) by mouth 2 (two) times daily with a meal.  Dispense: 60 tablet; Refill: 0 - Hemoglobin A1c; Future  3. Gastroesophageal reflux disease without esophagitis - omeprazole (PRILOSEC)  20 MG capsule; Take 1 capsule (20 mg total) by mouth daily as needed.  Dispense: 30 capsule; Refill: 0  4. Hyperlipidemia, unspecified hyperlipidemia type Plan to calculate ASCVD risk score. Suspect patient would benefit from statin given h/o DM and HTN.  - Lipid panel; Future   Follow Up Instructions: Lab visit; 3 month f/u    I discussed the assessment and treatment plan with the patient. The patient was provided an opportunity to ask questions and all were answered. The patient agreed with the plan and demonstrated an understanding of the instructions.   The patient was advised to call back or seek an in-person evaluation if the symptoms worsen or if the condition fails to improve as anticipated.     I provided 20 minutes total of non-face-to-face time during this encounter including median intraservice time, reviewing previous notes, investigations, ordering medications, medical decision making, coordinating care and patient verbalized understanding at the end of the visit.    Phill Myron, D.O. Primary Care at [facility]  10/23/2019, 9:06 AM

## 2019-10-23 NOTE — Addendum Note (Signed)
Addended by: Carylon Perches on: 10/23/2019 11:01 AM   Modules accepted: Orders

## 2019-11-03 ENCOUNTER — Other Ambulatory Visit: Payer: Self-pay

## 2019-11-03 ENCOUNTER — Encounter: Payer: Self-pay | Admitting: Internal Medicine

## 2019-11-03 ENCOUNTER — Other Ambulatory Visit (INDEPENDENT_AMBULATORY_CARE_PROVIDER_SITE_OTHER): Payer: Medicaid Other

## 2019-11-03 DIAGNOSIS — E119 Type 2 diabetes mellitus without complications: Secondary | ICD-10-CM

## 2019-11-03 DIAGNOSIS — E785 Hyperlipidemia, unspecified: Secondary | ICD-10-CM | POA: Diagnosis not present

## 2019-11-03 DIAGNOSIS — I10 Essential (primary) hypertension: Secondary | ICD-10-CM

## 2019-11-04 ENCOUNTER — Other Ambulatory Visit: Payer: Self-pay | Admitting: Internal Medicine

## 2019-11-04 DIAGNOSIS — E7849 Other hyperlipidemia: Secondary | ICD-10-CM

## 2019-11-04 LAB — COMPREHENSIVE METABOLIC PANEL
ALT: 17 IU/L (ref 0–32)
AST: 21 IU/L (ref 0–40)
Albumin/Globulin Ratio: 1.7 (ref 1.2–2.2)
Albumin: 4.5 g/dL (ref 3.8–4.8)
Alkaline Phosphatase: 68 IU/L (ref 48–121)
BUN/Creatinine Ratio: 18 (ref 12–28)
BUN: 17 mg/dL (ref 8–27)
Bilirubin Total: 0.3 mg/dL (ref 0.0–1.2)
CO2: 22 mmol/L (ref 20–29)
Calcium: 9.8 mg/dL (ref 8.7–10.3)
Chloride: 103 mmol/L (ref 96–106)
Creatinine, Ser: 0.92 mg/dL (ref 0.57–1.00)
GFR calc Af Amer: 78 mL/min/{1.73_m2} (ref >59)
GFR calc non Af Amer: 67 mL/min/{1.73_m2} (ref >59)
Globulin, Total: 2.6 g/dL (ref 1.5–4.5)
Glucose: 116 mg/dL — ABNORMAL HIGH (ref 65–99)
Potassium: 4.9 mmol/L (ref 3.5–5.2)
Sodium: 139 mmol/L (ref 134–144)
Total Protein: 7.1 g/dL (ref 6.0–8.5)

## 2019-11-04 LAB — HEMOGLOBIN A1C
Est. average glucose Bld gHb Est-mCnc: 180 mg/dL
Hgb A1c MFr Bld: 7.9 % — ABNORMAL HIGH (ref 4.8–5.6)

## 2019-11-04 LAB — LIPID PANEL
Chol/HDL Ratio: 4.6 ratio — ABNORMAL HIGH (ref 0.0–4.4)
Cholesterol, Total: 242 mg/dL — ABNORMAL HIGH (ref 100–199)
HDL: 53 mg/dL (ref >39)
LDL Chol Calc (NIH): 148 mg/dL — ABNORMAL HIGH (ref 0–99)
Triglycerides: 225 mg/dL — ABNORMAL HIGH (ref 0–149)
VLDL Cholesterol Cal: 41 mg/dL — ABNORMAL HIGH (ref 5–40)

## 2019-11-04 LAB — CBC WITH DIFFERENTIAL/PLATELET
Basophils Absolute: 0.1 10*3/uL (ref 0.0–0.2)
Basos: 1 %
EOS (ABSOLUTE): 0.6 10*3/uL — ABNORMAL HIGH (ref 0.0–0.4)
Eos: 8 %
Hematocrit: 36.4 % (ref 34.0–46.6)
Hemoglobin: 11.6 g/dL (ref 11.1–15.9)
Immature Grans (Abs): 0 10*3/uL (ref 0.0–0.1)
Immature Granulocytes: 1 %
Lymphocytes Absolute: 2.6 10*3/uL (ref 0.7–3.1)
Lymphs: 30 %
MCH: 28.7 pg (ref 26.6–33.0)
MCHC: 31.9 g/dL (ref 31.5–35.7)
MCV: 90 fL (ref 79–97)
Monocytes Absolute: 0.7 10*3/uL (ref 0.1–0.9)
Monocytes: 8 %
Neutrophils Absolute: 4.5 10*3/uL (ref 1.4–7.0)
Neutrophils: 52 %
Platelets: 316 10*3/uL (ref 150–450)
RBC: 4.04 x10E6/uL (ref 3.77–5.28)
RDW: 16.7 % — ABNORMAL HIGH (ref 11.7–15.4)
WBC: 8.5 10*3/uL (ref 3.4–10.8)

## 2019-11-04 LAB — MICROALBUMIN / CREATININE URINE RATIO
Creatinine, Urine: 66.5 mg/dL
Microalb/Creat Ratio: 19 mg/g creat (ref 0–29)
Microalbumin, Urine: 12.4 ug/mL

## 2019-11-04 MED ORDER — ATORVASTATIN CALCIUM 10 MG PO TABS: 40.0000 mg | ORAL_TABLET | Freq: Every day | ORAL | 1 refills | Status: DC

## 2019-11-06 ENCOUNTER — Telehealth: Payer: Self-pay

## 2019-11-06 ENCOUNTER — Ambulatory Visit: Payer: Medicaid Other | Admitting: Internal Medicine

## 2019-11-06 NOTE — Telephone Encounter (Signed)
1) Medication(s) Requested (by name): atenolol  2) [facility]acy [facility] [address]  3) Special Requests:  90 day supply needs approval per pharmacy   Approved medications will be sent to the pharmacy, we will reach out if there is an issue.  Requests made after 3pm may not be addressed until the following business day!  If a patient is unsure of the name of the medication(s) please note and ask patient to call back when they are able to provide all info, do not send to responsible party until all information is available!

## 2019-11-07 ENCOUNTER — Ambulatory Visit
Admission: RE | Admit: 2019-11-07 | Discharge: 2019-11-07 | Disposition: A | Discharge status: Home / Self Care | Payer: Medicaid Other | Source: Ambulatory Visit | Attending: Internal Medicine | Admitting: Internal Medicine

## 2019-11-07 ENCOUNTER — Other Ambulatory Visit: Payer: Self-pay

## 2019-11-07 DIAGNOSIS — K219 Gastro-esophageal reflux disease without esophagitis: Secondary | ICD-10-CM

## 2019-11-07 DIAGNOSIS — I10 Essential (primary) hypertension: Secondary | ICD-10-CM

## 2019-11-07 DIAGNOSIS — Z1231 Encounter for screening mammogram for malignant neoplasm of breast: Secondary | ICD-10-CM

## 2019-11-07 DIAGNOSIS — E119 Type 2 diabetes mellitus without complications: Secondary | ICD-10-CM

## 2019-11-07 MED ORDER — OMEPRAZOLE 20 MG PO CPDR: 20.0000 mg | DELAYED_RELEASE_CAPSULE | Freq: Every day | ORAL | 0 refills | Status: DC | PRN

## 2019-11-07 MED ORDER — LISINOPRIL 40 MG PO TABS: 40.0000 mg | ORAL_TABLET | Freq: Every day | ORAL | 0 refills | Status: DC

## 2019-11-07 MED ORDER — METFORMIN HCL 1000 MG PO TABS: 1000.0000 mg | ORAL_TABLET | Freq: Two times a day (BID) | ORAL | 0 refills | Status: DC

## 2019-11-07 MED ORDER — ATENOLOL 25 MG PO TABS: 25.0000 mg | ORAL_TABLET | Freq: Every day | ORAL | 0 refills | Status: DC

## 2019-11-07 MED ORDER — GLIPIZIDE 5 MG PO TABS: 5.0000 mg | ORAL_TABLET | Freq: Every day | ORAL | 0 refills | Status: DC

## 2019-11-14 NOTE — Progress Notes (Signed)
Patient notified of results & recommendations. Expressed understanding.

## 2020-01-09 ENCOUNTER — Encounter: Payer: Medicaid Other | Admitting: Internal Medicine

## 2020-01-15 ENCOUNTER — Encounter: Payer: Medicaid Other | Admitting: Gastroenterology

## 2020-01-22 ENCOUNTER — Ambulatory Visit: Payer: Medicaid Other | Admitting: Internal Medicine

## 2020-02-11 ENCOUNTER — Ambulatory Visit: Payer: Medicaid Other | Admitting: Internal Medicine

## 2020-02-12 ENCOUNTER — Other Ambulatory Visit: Payer: Self-pay

## 2020-02-12 ENCOUNTER — Encounter: Payer: Self-pay | Admitting: Internal Medicine

## 2020-02-12 ENCOUNTER — Ambulatory Visit (INDEPENDENT_AMBULATORY_CARE_PROVIDER_SITE_OTHER): Payer: Medicaid Other | Admitting: Internal Medicine

## 2020-02-12 VITALS — BP 158/75 | HR 81 | Temp 97.3°F | Resp 17 | Wt 158.0 lb

## 2020-02-12 DIAGNOSIS — E7849 Other hyperlipidemia: Secondary | ICD-10-CM | POA: Diagnosis not present

## 2020-02-12 DIAGNOSIS — K219 Gastro-esophageal reflux disease without esophagitis: Secondary | ICD-10-CM

## 2020-02-12 DIAGNOSIS — E119 Type 2 diabetes mellitus without complications: Secondary | ICD-10-CM | POA: Diagnosis not present

## 2020-02-12 DIAGNOSIS — I10 Essential (primary) hypertension: Secondary | ICD-10-CM

## 2020-02-12 DIAGNOSIS — L03311 Cellulitis of abdominal wall: Secondary | ICD-10-CM | POA: Diagnosis not present

## 2020-02-12 LAB — POCT GLYCOSYLATED HEMOGLOBIN (HGB A1C): Hemoglobin A1C: 6.2 % — AB (ref 4.0–5.6)

## 2020-02-12 MED ORDER — ATENOLOL 25 MG PO TABS: 25.0000 mg | ORAL_TABLET | Freq: Two times a day (BID) | ORAL | 0 refills | Status: DC

## 2020-02-12 MED ORDER — DOXYCYCLINE HYCLATE 100 MG PO TABS: 100.0000 mg | ORAL_TABLET | Freq: Two times a day (BID) | ORAL | 0 refills | Status: DC

## 2020-02-12 MED ORDER — METFORMIN HCL 1000 MG PO TABS: 1000.0000 mg | ORAL_TABLET | Freq: Two times a day (BID) | ORAL | 0 refills | Status: DC

## 2020-02-12 MED ORDER — ACCU-CHEK FASTCLIX LANCETS MISC: 2 refills | Status: AC

## 2020-02-12 MED ORDER — ACCU-CHEK FASTCLIX LANCET KIT: PACK | 0 refills | Status: AC

## 2020-02-12 MED ORDER — ATORVASTATIN CALCIUM 10 MG PO TABS: 40.0000 mg | ORAL_TABLET | Freq: Every day | ORAL | 1 refills | Status: DC

## 2020-02-12 MED ORDER — OMEPRAZOLE 20 MG PO CPDR: 20.0000 mg | DELAYED_RELEASE_CAPSULE | Freq: Every day | ORAL | 0 refills | Status: DC | PRN

## 2020-02-12 MED ORDER — LISINOPRIL 40 MG PO TABS: 40.0000 mg | ORAL_TABLET | Freq: Every day | ORAL | 0 refills | Status: DC

## 2020-02-12 MED ORDER — GLIPIZIDE 5 MG PO TABS: 5.0000 mg | ORAL_TABLET | Freq: Every day | ORAL | 0 refills | Status: DC

## 2020-02-12 MED ORDER — ACCU-CHEK GUIDE VI STRP: ORAL_STRIP | 3 refills | Status: AC

## 2020-02-12 NOTE — Progress Notes (Signed)
Subjective:    Stacy Torres - 59 y.o. female MRN [id number]  Date of birth: 02-07-1961  HPI  STUART GUILLEN is here for follow up of chronic medical conditons.  Diabetes mellitus, Type 2 Disease Monitoring             Blood Sugar Ranges: Not monitoring              Polyuria: no              Visual problems: no   Urine Microalbumin 19 (Aug 2021)   Last A1C: 7.9 (Aug 2021)   Medications: Glipizide 5 mg, Metformin 1000 mg BID  Medication Compliance: yes  Medication Side Effects             Hypoglycemia: no   Chronic HTN Disease Monitoring:  Home BP Monitoring - Does not monitor  Chest pain- no  Dyspnea- no Headache - no  Medications: Atenolol 25 mgm Lisinopril 40 mg  Compliance- yes Lightheadedness- no  Edema- no            Health Maintenance:  Health Maintenance Due  Topic Date Due  . COVID-19 Vaccine (1) Never done  . COLONOSCOPY  Never done  . OPHTHALMOLOGY EXAM  10/09/2013    -  reports that she has never smoked. She has never used smokeless tobacco. - Review of Systems: Per HPI. - Past Medical History: Patient Active Problem List   Diagnosis Date Noted  . Depression 04/17/2016  . Psoriasis 02/03/2015  . Obesity (BMI 30-39.9) 03/21/2012  . GERD (gastroesophageal reflux disease) 01/13/2012  . Hyperlipidemia 06/10/2011  . DM (diabetes mellitus) ([place]) 06/09/2011  . HTN (hypertension) 06/09/2011   - Medications: reviewed and updated   Objective:   Physical Exam BP (!) 158/75   Pulse 81   Temp (!) 97.3 F (36.3 C) (Temporal)   Resp 17   Wt 158 lb (71.7 kg)   SpO2 96%   BMI 27.99 kg/m  Physical Exam Constitutional:      General: She is not in acute distress.    Appearance: She is not diaphoretic.  HENT:     Head: Normocephalic and atraumatic.  Eyes:     Conjunctiva/sclera: Conjunctivae normal.  Cardiovascular:     Rate and Rhythm: Normal rate and regular rhythm.     Heart sounds: Normal heart sounds. No murmur  heard.   Pulmonary:     Effort: Pulmonary effort is normal. No respiratory distress.     Breath sounds: Normal breath sounds.  Musculoskeletal:        General: Normal range of motion.  Skin:    General: Skin is warm and dry.     Comments: Abdominal wall with what appears to be weeping bug bite with surrounding erythema and warmth of skin.   Neurological:     Mental Status: She is alert and oriented to person, place, and time.  Psychiatric:        Mood and Affect: Affect normal.        Judgment: Judgment normal.            Assessment & Plan:    1. Type 2 diabetes mellitus without complication, without long-term current use of insulin (HCC) A1c 6.2, at goal. Continue current diabetic regimen.  Counseled on Diabetic diet, my plate method, 998 minutes of moderate intensity exercise/week Blood sugar logs with fasting goals of 80-120 mg/dl, random of less than 180 and in the event of sugars less than 60 mg/dl or greater  than 400 mg/dl encouraged to notify the clinic. Advised on the need for annual eye exams, annual foot exams, Pneumonia vaccine. - HgB A1c - HM Diabetes Foot Exam - Ambulatory referral to Ophthalmology - glipiZIDE (GLUCOTROL) 5 MG tablet; Take 1 tablet (5 mg total) by mouth daily before breakfast.  Dispense: 90 tablet; Refill: 0 - metFORMIN (GLUCOPHAGE) 1000 MG tablet; Take 1 tablet (1,000 mg total) by mouth 2 (two) times daily with a meal.  Dispense: 180 tablet; Refill: 0 - glucose blood (ACCU-CHEK GUIDE) test strip; Check fasting blood sugar daily. Dx: E11.9  Dispense: 100 each; Refill: 3 - Lancets Misc. (ACCU-CHEK FASTCLIX LANCET) KIT; Check fasting blood sugar daily. Dx: E11.9  Dispense: 1 kit; Refill: 0 - Accu-Chek FastClix Lancets MISC; Check fasting blood sugar daily. Dx: E11.9  Dispense: 102 each; Refill: 2  2. Essential hypertension BP above goal with results of 158/75 and 152/71 in office. Increase Atenolol as pulse appears to be able to tolerate and  Lisinopril is at max dose. Will follow up and add additional therapy as needed.  - atenolol (TENORMIN) 25 MG tablet; Take 1 tablet (25 mg total) by mouth 2 (two) times daily.  Dispense: 180 tablet; Refill: 0 - lisinopril (ZESTRIL) 40 MG tablet; Take 1 tablet (40 mg total) by mouth daily.  Dispense: 90 tablet; Refill: 0  3. Other hyperlipidemia - atorvastatin (LIPITOR) 10 MG tablet; Take 4 tablets (40 mg total) by mouth daily.  Dispense: 90 tablet; Refill: 1  4. Gastroesophageal reflux disease without esophagitis - omeprazole (PRILOSEC) 20 MG capsule; Take 1 capsule (20 mg total) by mouth daily as needed.  Dispense: 90 capsule; Refill: 0  5. Cellulitis of abdominal wall Exam appears consistent with cellulitis. Treat with antibiotic. Return precautions for worsening infection/antibiotic failure discussed.  - doxycycline (VIBRA-TABS) 100 MG tablet; Take 1 tablet (100 mg total) by mouth 2 (two) times daily.  Dispense: 14 tablet; Refill: 0    Phill Myron, D.O. 02/12/2020, 4:17 PM Primary Care at [facility]

## 2020-03-25 ENCOUNTER — Ambulatory Visit: Payer: Medicaid Other | Admitting: Internal Medicine

## 2020-05-06 ENCOUNTER — Other Ambulatory Visit: Payer: Self-pay | Admitting: Internal Medicine

## 2020-05-06 DIAGNOSIS — E119 Type 2 diabetes mellitus without complications: Secondary | ICD-10-CM

## 2020-05-25 ENCOUNTER — Telehealth: Payer: Self-pay | Admitting: Internal Medicine

## 2020-05-25 NOTE — Telephone Encounter (Signed)
Pt requesting refills of the following medications:    lisinopril (ZESTRIL) 40 MG tablet  atenolol (TENORMIN) 25 MG tablet  glipiZIDE (GLUCOTROL) 5 MG tablet  doxycycline (VIBRA-TABS) 100 MG tablet  omeprazole (PRILOSEC) 20 MG capsule   [id number] [place], [place] - [address] (Ph: [phone])

## 2020-06-04 ENCOUNTER — Other Ambulatory Visit: Payer: Self-pay | Admitting: Internal Medicine

## 2020-06-04 DIAGNOSIS — E119 Type 2 diabetes mellitus without complications: Secondary | ICD-10-CM

## 2020-06-04 DIAGNOSIS — I10 Essential (primary) hypertension: Secondary | ICD-10-CM

## 2020-06-04 DIAGNOSIS — E7849 Other hyperlipidemia: Secondary | ICD-10-CM

## 2020-06-04 DIAGNOSIS — K219 Gastro-esophageal reflux disease without esophagitis: Secondary | ICD-10-CM

## 2020-06-08 ENCOUNTER — Other Ambulatory Visit: Payer: Self-pay | Admitting: Internal Medicine

## 2020-06-10 ENCOUNTER — Telehealth (INDEPENDENT_AMBULATORY_CARE_PROVIDER_SITE_OTHER): Payer: Medicaid Other | Admitting: Internal Medicine

## 2020-06-10 ENCOUNTER — Encounter: Payer: Self-pay | Admitting: Internal Medicine

## 2020-06-10 DIAGNOSIS — L089 Local infection of the skin and subcutaneous tissue, unspecified: Secondary | ICD-10-CM

## 2020-06-10 MED ORDER — DOXYCYCLINE HYCLATE 100 MG PO TABS: 100.0000 mg | ORAL_TABLET | Freq: Two times a day (BID) | ORAL | 0 refills | Status: DC

## 2020-06-10 NOTE — Progress Notes (Signed)
Virtual Visit via Telephone Note  I connected with Stacy Torres, on 06/10/2020 at 10:38 AM by telephone due to the COVID-19 pandemic and verified that I am speaking with the correct person using two identifiers.   Consent: I discussed the limitations, risks, security and privacy concerns of performing an evaluation and management service by telephone and the availability of in person appointments. I also discussed with the patient that there may be a patient responsible charge related to this service. The patient expressed understanding and agreed to proceed.   Location of Patient: Home   Location of Provider: Clinic    Persons participating in Telemedicine visit: [place] Dr. Juleen China   History of Present Illness: Patient has a visit for concerns about hands. She thought it was initially a regular break out of psoriasis but now seems to have pus pockets forming. Has been using a topical antibiotic ointment that will clear them up but then they return. Says looks like little blisters filled with yellow pus. Has about ten total. Erythema surrounding them. No contact with poison ivy/other plants or recent yard work. No cuts on hands or insect bites/stings. No IV drug use. No household cleaning products or history of recent burns. Thinks she might have a mild grade fever but doesn't have a thermometer. No nausea or vomiting.    Past Medical History:  Diagnosis Date   Anxiety    Phreesia 10/23/2019   Arthritis    Phreesia 10/23/2019   Diabetes mellitus without complication ([place])    Hyperlipidemia    Phreesia 10/23/2019   Hypertension    No Known Allergies  Current Outpatient Medications on File Prior to Visit  Medication Sig Dispense Refill   atenolol (TENORMIN) 25 MG tablet TAKE 1 TABLET BY MOUTH TWICE A DAY 180 tablet 0   atorvastatin (LIPITOR) 10 MG tablet TAKE 4 TABLETS BY MOUTH DAILY. 90 tablet 1   glipiZIDE (GLUCOTROL) 5 MG tablet TAKE  1 TABLET BY MOUTH DAILY BEFORE BREAKFAST. 90 tablet 0   lisinopril (ZESTRIL) 40 MG tablet TAKE 1 TABLET BY MOUTH EVERY DAY 90 tablet 0   metFORMIN (GLUCOPHAGE) 1000 MG tablet TAKE 1 TABLET (1,000 MG TOTAL) BY MOUTH 2 (TWO) TIMES DAILY WITH A MEAL. 180 tablet 0   omeprazole (PRILOSEC) 20 MG capsule TAKE 1 CAPSULE BY MOUTH DAILY AS NEEDED. 90 capsule 0   Accu-Chek FastClix Lancets MISC Check fasting blood sugar daily. Dx: E11.9 102 each 2   blood glucose meter kit and supplies KIT Dispense based on patient and insurance preference. Use up to BID  times daily as directed to check blood sugar. E11.89 1 each 0   doxycycline (VIBRA-TABS) 100 MG tablet Take 1 tablet (100 mg total) by mouth 2 (two) times daily. 14 tablet 0   glucose blood (ACCU-CHEK GUIDE) test strip Check fasting blood sugar daily. Dx: E11.9 100 each 3   Lancets Misc. (ACCU-CHEK FASTCLIX LANCET) KIT Check fasting blood sugar daily. Dx: E11.9 1 kit 0   No current facility-administered medications on file prior to visit.    Observations/Objective: NAD. Speaking clearly.  Work of breathing normal.  Alert and oriented. Mood appropriate.   Assessment and Plan: 1. Soft tissue infection Sounds consistent with soft tissue infection; although difficult to fully assess over the phone. Does not have any known chemical burns or contact with poisonous plants that would cause these symptoms. Will start Doxycyline for presumed cellulitis with MRSA coverage. Discussed with patient that if does not improve  in next 24-48 hours or has worsening of symptoms at any time needs to be seen at [facility] or ER for prompt evaluation. Discussed that if fever worsens and if she develops tachycardia, rapid breathing, etc would have concern for sepsis from soft tissue infection and would be another reason to seek emergent care. Patient sounds well over the phone and reports she overall feels well.  - doxycycline (VIBRA-TABS) 100 MG tablet; Take 1 tablet (100 mg  total) by mouth 2 (two) times daily.  Dispense: 20 tablet; Refill: 0   Follow Up Instructions: PRN and for routine medical care    I discussed the assessment and treatment plan with the patient. The patient was provided an opportunity to ask questions and all were answered. The patient agreed with the plan and demonstrated an understanding of the instructions.   The patient was advised to call back or seek an in-person evaluation if the symptoms worsen or if the condition fails to improve as anticipated.     I provided 12 minutes total of non-face-to-face time during this encounter including median intraservice time, reviewing previous notes, investigations, ordering medications, medical decision making, coordinating care and patient verbalized understanding at the end of the visit.    Phill Myron, D.O. Primary Care at [facility]  06/10/2020, 10:38 AM

## 2020-06-10 NOTE — Progress Notes (Signed)
States she has infection in hands. Has pus draining from bumps on hands x 1 week Initially she thought It was r/t to psoriasis  Painful and swollen

## 2020-06-16 ENCOUNTER — Other Ambulatory Visit: Payer: Self-pay

## 2020-06-16 ENCOUNTER — Ambulatory Visit (INDEPENDENT_AMBULATORY_CARE_PROVIDER_SITE_OTHER): Payer: Medicaid Other | Admitting: Internal Medicine

## 2020-06-16 ENCOUNTER — Encounter: Payer: Self-pay | Admitting: Internal Medicine

## 2020-06-16 VITALS — BP 162/86 | HR 106 | Temp 97.3°F | Resp 16 | Wt 166.0 lb

## 2020-06-16 DIAGNOSIS — I10 Essential (primary) hypertension: Secondary | ICD-10-CM

## 2020-06-16 DIAGNOSIS — L304 Erythema intertrigo: Secondary | ICD-10-CM

## 2020-06-16 DIAGNOSIS — E119 Type 2 diabetes mellitus without complications: Secondary | ICD-10-CM | POA: Diagnosis not present

## 2020-06-16 LAB — POCT GLYCOSYLATED HEMOGLOBIN (HGB A1C): HbA1c, POC (controlled diabetic range): 7.8 % — AB (ref 0.0–7.0)

## 2020-06-16 LAB — GLUCOSE, POCT (MANUAL RESULT ENTRY): POC Glucose: 160 mg/dl — AB (ref 70–99)

## 2020-06-16 MED ORDER — HYDROCHLOROTHIAZIDE 25 MG PO TABS: 25.0000 mg | ORAL_TABLET | Freq: Every day | ORAL | 3 refills | Status: DC

## 2020-06-16 MED ORDER — ATENOLOL 25 MG PO TABS: 25.0000 mg | ORAL_TABLET | Freq: Every day | ORAL | 0 refills | Status: DC

## 2020-06-16 MED ORDER — CLOTRIMAZOLE-BETAMETHASONE 1-0.05 % EX CREA: 1.0000 "application " | TOPICAL_CREAM | Freq: Two times a day (BID) | CUTANEOUS | 0 refills | Status: DC

## 2020-06-16 MED ORDER — BLOOD PRESSURE KIT: 1.0000 | PACK | Freq: Every day | 0 refills | Status: DC

## 2020-06-16 NOTE — Progress Notes (Signed)
Subjective:    Stacy Torres - 60 y.o. female MRN [id number]  Date of birth: 1960-08-19  HPI  Stacy Torres is here for follow up of chronic medical conditions.  Diabetes mellitus, Type 2 Disease Monitoring             Blood Sugar Ranges: Not monitoring              Polyuria: no             Visual problems: no   Urine Microalbumin 19 (Aug 2021)-on Ace inhibitor   Last A1C: 6.2 (Nov 2021)   Medications: Glipizide 5 mg, Metformin 1000 mg BID  Medication Compliance: yes  Medication Side Effects             Hypoglycemia: no   Chronic HTN Disease Monitoring:  Home BP Monitoring - Does not monitor her BP at home. Unable to afford a cuff.  Chest pain- no  Dyspnea- no Headache - no  Medications: Atenolol 25 mg BID, Lisinopril 40 mg  Compliance- no, reports unable to tolerate the side effects of Atenolol due to increased dose and is only taking once per day  Lightheadedness- no  Edema- no   Realized that the meals she was eating to try to lose weight were very high in Na. She was eating frozen steamer meals and she is cutting down on that.     Health Maintenance:  Health Maintenance Due  Topic Date Due  . COVID-19 Vaccine (1) Never done  . COLONOSCOPY (Pts 45-8yr Insurance coverage will need to be confirmed)  Never done    -  reports that she has never smoked. She has never used smokeless tobacco. - Review of Systems: Per HPI. - Past Medical History: Patient Active Problem List   Diagnosis Date Noted  . Depression 04/17/2016  . Psoriasis 02/03/2015  . Obesity (BMI 30-39.9) 03/21/2012  . GERD (gastroesophageal reflux disease) 01/13/2012  . Hyperlipidemia 06/10/2011  . DM (diabetes mellitus) ([place] 06/09/2011  . HTN (hypertension) 06/09/2011   - Medications: reviewed and updated   Objective:   Physical Exam BP (!) 162/86   Pulse (!) 106   Temp (!) 97.3 F (36.3 C)   Resp 16   Wt 166 lb (75.3 kg)   SpO2 98%   BMI 29.41 kg/m   Physical Exam Constitutional:      General: She is not in acute distress.    Appearance: She is not diaphoretic.  HENT:     Head: Normocephalic and atraumatic.  Eyes:     Conjunctiva/sclera: Conjunctivae normal.  Cardiovascular:     Rate and Rhythm: Normal rate and regular rhythm.     Heart sounds: Normal heart sounds. No murmur heard.   Pulmonary:     Effort: Pulmonary effort is normal. No respiratory distress.     Breath sounds: Normal breath sounds.  Musculoskeletal:        General: Normal range of motion.  Skin:    General: Skin is warm and dry.     Comments: Beery red plaques present in right axilla.   Neurological:     Mental Status: She is alert and oriented to person, place, and time.  Psychiatric:        Mood and Affect: Affect normal.        Judgment: Judgment normal.            Assessment & Plan:   1. Type 2 diabetes mellitus without complication, without long-term current use of insulin (  HCC) A1c worsened from 6.2>7.8. Patient reports compliance with her medications. However, thinks this is likely due to dietary indiscretions. Discussed appropriate DM diet and need for regular exercise. Continue current regimen with lifestyle changes and monitor in 3 months with repeat A1c.  Counseled on Diabetic diet, my plate method, 299 minutes of moderate intensity exercise/week Blood sugar logs with fasting goals of 80-120 mg/dl, random of less than 180 and in the event of sugars less than 60 mg/dl or greater than 400 mg/dl encouraged to notify the clinic. Advised on the need for annual eye exams, annual foot exams, Pneumonia vaccine. - Glucose (CBG) - HgB A1c  2. Essential hypertension BP above goal today. Has been taking Atenolol only once per day due to perceived side effects. Will start HCTZ due to persistently elevated BP. Patient unable to monitor at home for comparison; will attempt to prescribe BP cuff but discussed this may not be covered by insurance. Will have  patient return in 1-2 weeks for RN visit for BP check and lab monitoring with new medication.  - hydrochlorothiazide (HYDRODIURIL) 25 MG tablet; Take 1 tablet (25 mg total) by mouth daily.  Dispense: 90 tablet; Refill: 3 - atenolol (TENORMIN) 25 MG tablet; Take 1 tablet (25 mg total) by mouth daily.  Dispense: 180 tablet; Refill: 0 - Basic Metabolic Panel; Future - Blood Pressure KIT; 1 Device by Does not apply route daily.  Dispense: 1 kit; Refill: 0  3. Intertrigo Rash in axilla appears consistent with intertrigo. Lotrisone Rx given. Discussed aeration of area, avoidance of tight clothing, ensuring dry after showering, no soaking in tubs, etc.  - clotrimazole-betamethasone (LOTRISONE) cream; Apply 1 application topically 2 (two) times daily.  Dispense: 30 g; Refill: 0   Phill Myron, D.O. 06/16/2020, 3:46 PM Primary Care at [facility]

## 2020-06-16 NOTE — Progress Notes (Signed)
Address skin peeling on hands  CBG- 160 A1C- 7.8

## 2020-06-16 NOTE — Patient Instructions (Signed)

## 2020-07-30 ENCOUNTER — Other Ambulatory Visit: Payer: Self-pay | Admitting: Internal Medicine

## 2020-07-30 DIAGNOSIS — E7849 Other hyperlipidemia: Secondary | ICD-10-CM

## 2020-08-31 ENCOUNTER — Other Ambulatory Visit: Payer: Self-pay | Admitting: Internal Medicine

## 2020-08-31 DIAGNOSIS — I10 Essential (primary) hypertension: Secondary | ICD-10-CM

## 2020-08-31 DIAGNOSIS — E119 Type 2 diabetes mellitus without complications: Secondary | ICD-10-CM

## 2020-08-31 DIAGNOSIS — K219 Gastro-esophageal reflux disease without esophagitis: Secondary | ICD-10-CM

## 2020-09-01 ENCOUNTER — Other Ambulatory Visit: Payer: Self-pay | Admitting: Internal Medicine

## 2020-09-01 DIAGNOSIS — L304 Erythema intertrigo: Secondary | ICD-10-CM

## 2020-09-01 DIAGNOSIS — E119 Type 2 diabetes mellitus without complications: Secondary | ICD-10-CM

## 2020-09-10 ENCOUNTER — Ambulatory Visit (INDEPENDENT_AMBULATORY_CARE_PROVIDER_SITE_OTHER): Payer: Medicaid Other

## 2020-09-10 ENCOUNTER — Encounter: Payer: Self-pay | Admitting: Emergency Medicine

## 2020-09-10 ENCOUNTER — Other Ambulatory Visit: Payer: Self-pay

## 2020-09-10 ENCOUNTER — Ambulatory Visit
Admission: EM | Admit: 2020-09-10 | Discharge: 2020-09-10 | Disposition: A | Discharge status: Home / Self Care | Payer: Medicaid Other | Attending: Emergency Medicine | Admitting: Emergency Medicine

## 2020-09-10 DIAGNOSIS — R222 Localized swelling, mass and lump, trunk: Secondary | ICD-10-CM | POA: Diagnosis not present

## 2020-09-10 DIAGNOSIS — H66001 Acute suppurative otitis media without spontaneous rupture of ear drum, right ear: Secondary | ICD-10-CM | POA: Diagnosis not present

## 2020-09-10 DIAGNOSIS — S23429A Unspecified sprain of sternum, initial encounter: Secondary | ICD-10-CM

## 2020-09-10 MED ORDER — AMOXICILLIN-POT CLAVULANATE 875-125 MG PO TABS: 1.0000 | ORAL_TABLET | Freq: Two times a day (BID) | ORAL | 0 refills | Status: DC

## 2020-09-10 MED ORDER — IBUPROFEN 600 MG PO TABS: 600.0000 mg | ORAL_TABLET | Freq: Four times a day (QID) | ORAL | 0 refills | Status: DC | PRN

## 2020-09-10 NOTE — Discharge Instructions (Signed)
Begin Augmentin twice daily for 1 week to treat ear infection Ibuprofen and Tylenol chest discomfort/ear pain Warm compresses to chest Follow-up with primary care for swelling to chest

## 2020-09-10 NOTE — ED Triage Notes (Addendum)
Patient c/o "lump on chest" x 2 weeks.   Patient endorses " the lump is in the middle of my breast".   Patient endorses pain. Patient endorses increased pain " when wearing my bra". 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   Patient denies any drainage.   Patient has used Advil with some relief of pain.   Patient c/o RT ear pain x 1 month.   Patient endorses drainage.   Patient endorses " I've had issues with it because of my tooth on that side".   Patient denies any hearing changes.

## 2020-09-10 NOTE — ED Provider Notes (Signed)
[facility]    CSN: [id number] Arrival date & time: 09/10/20  1449      History   Chief Complaint Chief Complaint  Patient presents with   Abscess   Otalgia    HPI Stacy Torres is a 60 y.o. female presenting today for evaluation of lump on chest and ear pain.  Over the past week has developed area to central lower chest that has been swollen.  Denies skin changes.  Often the size of a walnut.  Denies any drainage, feels more internal.  Denies history of similar.  Also concerned about right ear infection-reports ear pain and alterations in hearing x1 month.  HPI  Past Medical History:  Diagnosis Date   Anxiety    Phreesia 10/23/2019   Arthritis    Phreesia 10/23/2019   Diabetes mellitus without complication ([place])    Hyperlipidemia    Phreesia 10/23/2019   Hypertension     Patient Active Problem List   Diagnosis Date Noted   Depression 04/17/2016   Psoriasis 02/03/2015   Obesity (BMI 30-39.9) 03/21/2012   GERD (gastroesophageal reflux disease) 01/13/2012   Hyperlipidemia 06/10/2011   DM (diabetes mellitus) ([place]) 06/09/2011   HTN (hypertension) 06/09/2011    History reviewed. No pertinent surgical history.  OB History   No obstetric history on file.      Home Medications    Prior to Admission medications   Medication Sig Start Date End Date Taking? Authorizing Provider  amoxicillin-clavulanate (AUGMENTIN) 875-125 MG tablet Take 1 tablet by mouth every 12 (twelve) hours for 7 days. 09/10/20 09/17/20 Yes Orlandria Kissner C, PA-C  atenolol (TENORMIN) 25 MG tablet Take 1 tablet (25 mg total) by mouth daily. 06/16/20  Yes Nicolette Bang, DO  atorvastatin (LIPITOR) 10 MG tablet TAKE 4 TABLETS BY MOUTH DAILY 07/30/20  Yes Nicolette Bang, DO  glipiZIDE (GLUCOTROL) 5 MG tablet TAKE 1 TABLET BY MOUTH EVERY DAY BEFORE BREAKFAST 08/31/20  Yes Nicolette Bang, DO  ibuprofen (ADVIL) 600 MG tablet Take 1 tablet (600 mg total)  by mouth every 6 (six) hours as needed. 09/10/20  Yes Porshea Janowski C, PA-C  lisinopril (ZESTRIL) 40 MG tablet TAKE 1 TABLET BY MOUTH EVERY DAY 08/31/20  Yes Nicolette Bang, DO  metFORMIN (GLUCOPHAGE) 1000 MG tablet TAKE 1 TABLET (1,000 MG TOTAL) BY MOUTH 2 (TWO) TIMES DAILY WITH A MEAL. 09/01/20  Yes Nicolette Bang, DO  omeprazole (PRILOSEC) 20 MG capsule TAKE 1 CAPSULE BY MOUTH EVERY DAY AS NEEDED 08/31/20  Yes Nicolette Bang, DO  Accu-Chek FastClix Lancets MISC Check fasting blood sugar daily. Dx: E11.9 02/12/20   Nicolette Bang, DO  blood glucose meter kit and supplies KIT Dispense based on patient and insurance preference. Use up to BID  times daily as directed to check blood sugar. E11.89 10/10/18   Scot Jun, FNP  Blood Pressure KIT 1 Device by Does not apply route daily. 06/16/20   Nicolette Bang, DO  clotrimazole-betamethasone (LOTRISONE) cream APPLY TO AFFECTED AREA TWICE A DAY 09/01/20   Nicolette Bang, DO  glucose blood (ACCU-CHEK GUIDE) test strip Check fasting blood sugar daily. Dx: E11.9 02/12/20   Nicolette Bang, DO  Lancets Misc. (ACCU-CHEK FASTCLIX LANCET) KIT Check fasting blood sugar daily. Dx: E11.9 02/12/20   Nicolette Bang, DO    Family History Family History  Problem Relation Age of Onset   Breast cancer Daughter 6       And again 57  years later    Social History Social History   Tobacco Use   Smoking status: Never   Smokeless tobacco: Never  Substance Use Topics   Alcohol use: No   Drug use: No     Allergies   Patient has no known allergies.   Review of Systems Review of Systems  Constitutional:  Negative for fatigue and fever.  HENT:  Positive for ear pain. Negative for mouth sores.   Eyes:  Negative for visual disturbance.  Respiratory:  Negative for shortness of breath.   Cardiovascular:  Positive for chest pain.  Gastrointestinal:  Negative for abdominal  pain, nausea and vomiting.  Genitourinary:  Negative for genital sores.  Musculoskeletal:  Negative for arthralgias and joint swelling.  Skin:  Negative for color change, rash and wound.  Neurological:  Negative for dizziness, weakness, light-headedness and headaches.    Physical Exam Triage Vital Signs ED Triage Vitals  Enc Vitals Group     BP      Pulse      Resp      Temp      Temp src      SpO2      Weight      Height      Head Circumference      Peak Flow      Pain Score      Pain Loc      Pain Edu?      Excl. in [place]?    No data found.  Updated Vital Signs BP 111/70 (BP Location: Left Arm)   Pulse 92   Temp 98.8 F (37.1 C) (Oral)   Resp 13   SpO2 92%   Visual Acuity Right Eye Distance:   Left Eye Distance:   Bilateral Distance:    Right Eye Near:   Left Eye Near:    Bilateral Near:     Physical Exam Vitals and nursing note reviewed.  Constitutional:      Appearance: She is well-developed.     Comments: No acute distress  HENT:     Head: Normocephalic and atraumatic.     Ears:     Comments: Right TM dull opaque with poor bony landmarks, erythematous    Nose: Nose normal.  Eyes:     Conjunctiva/sclera: Conjunctivae normal.  Cardiovascular:     Rate and Rhythm: Normal rate.  Pulmonary:     Effort: Pulmonary effort is normal. No respiratory distress.     Comments: Breathing comfortably at rest, CTABL, no wheezing, rales or other adventitious sounds auscultated   Abdominal:     General: There is no distension.  Musculoskeletal:        General: Normal range of motion.     Cervical back: Neck supple.     Comments: Central lower sternum with localized area of swelling and tenderness without overlying erythema or warmth, no fluctuance or induration, area feels slightly firm  Skin:    General: Skin is warm and dry.  Neurological:     Mental Status: She is alert and oriented to person, place, and time.     UC Treatments / Results  Labs (all  labs ordered are listed, but only abnormal results are displayed) Labs Reviewed - No data to display  EKG   Radiology DG Sternum  Result Date: 09/10/2020 CLINICAL DATA:  Sternal lump. EXAM: STERNUM - 2+ VIEW COMPARISON:  None. FINDINGS: There is no evidence of fracture or other focal bone lesions. IMPRESSION: Negative. Electronically Signed  By: Marijo Conception M.D.   On: 09/10/2020 15:41    Procedures Procedures (including critical care time)  Medications Ordered in UC Medications - No data to display  Initial Impression / Assessment and Plan / UC Course  I have reviewed the triage vital signs and the nursing notes.  Pertinent labs & imaging results that were available during my care of the patient were reviewed by me and considered in my medical decision making (see chart for details).     X-ray negative for any bony lesions, exam not suggestive of abscess, possible underlying cyst versus other soft tissue swelling and recommending anti-inflammatories and warm compresses.  May consider ultrasounding if symptoms persisting to evaluate further nature of swelling.  Discussed strict return precautions. Patient verbalized understanding and is agreeable with plan.  Final Clinical Impressions(s) / UC Diagnoses   Final diagnoses:  Localized swelling of chest wall  Non-recurrent acute suppurative otitis media of right ear without spontaneous rupture of tympanic membrane     Discharge Instructions      Begin Augmentin twice daily for 1 week to treat ear infection Ibuprofen and Tylenol chest discomfort/ear pain Warm compresses to chest Follow-up with primary care for swelling to chest     ED Prescriptions     Medication Sig Dispense Auth. Provider   amoxicillin-clavulanate (AUGMENTIN) 875-125 MG tablet Take 1 tablet by mouth every 12 (twelve) hours for 7 days. 14 tablet Anaia Frith C, PA-C   ibuprofen (ADVIL) 600 MG tablet Take 1 tablet (600 mg total) by mouth every 6  (six) hours as needed. 30 tablet Aydyn Testerman, [place] C, PA-C      PDMP not reviewed this encounter.   Janith Lima, PA-C 09/10/20 1551

## 2020-09-14 NOTE — Progress Notes (Signed)
Patient ID: Stacy Torres, female    DOB: 1960/07/20  MRN: [id number]  CC: Lump In Chest   Subjective: Stacy Torres is a 60 y.o. female who presents for lump in chest.  Her concerns today include:   SWELLING OF CHEST WALL FOLLOW-UP: Visit 09/10/2020 at [facility] per PA note: HPI Stacy Torres is a 60 y.o. female presenting today for evaluation of lump on chest and ear pain.  Over the past week has developed area to central lower chest that has been swollen.  Denies skin changes.  Often the size of a walnut.  Denies any drainage, feels more internal.  Denies history of similar.    A&P X-ray negative for any bony lesions, exam not suggestive of abscess, possible underlying cyst versus other soft tissue swelling and recommending anti-inflammatories and warm compresses.  May consider ultrasounding if symptoms persisting to evaluate further nature of swelling.   09/15/2020: Initially noticed the lump about 2 weeks ago when checking breasts at home as she normally does. Reports she works in a [employer]. Within minutes of working the lump in the middle of her chest changes sizes from walnut-sized to the size of an egg. With rest the lump decreases. Endorses pain, taking Ibuprofen to help. Endorses shortness of breath and chest pain with exertion. Denies drainage. She is not a smoker.  Last mammogram August 2021.   2. EAR INFECTION FOLLOW-UP: Visit 09/10/2020 at [facility] at [facility] per PA note: Prescribed Augmentin.   09/15/2020: Right ear still having pain and some drainage. Did not take Augmentin because it caused upset stomach.     Patient Active Problem List   Diagnosis Date Noted   Depression 04/17/2016   Psoriasis 02/03/2015   Obesity (BMI 30-39.9) 03/21/2012   GERD (gastroesophageal reflux disease) 01/13/2012   Hyperlipidemia 06/10/2011   DM (diabetes mellitus) ([place]) 06/09/2011   HTN (hypertension)  06/09/2011     Current Outpatient Medications on File Prior to Visit  Medication Sig Dispense Refill   atenolol (TENORMIN) 25 MG tablet Take 1 tablet (25 mg total) by mouth daily. 180 tablet 0   atorvastatin (LIPITOR) 10 MG tablet TAKE 4 TABLETS BY MOUTH DAILY 90 tablet 1   blood glucose meter kit and supplies KIT Dispense based on patient and insurance preference. Use up to BID  times daily as directed to check blood sugar. E11.89 1 each 0   Blood Pressure KIT 1 Device by Does not apply route daily. 1 kit 0   clotrimazole-betamethasone (LOTRISONE) cream APPLY TO AFFECTED AREA TWICE A DAY 30 g 0   glipiZIDE (GLUCOTROL) 5 MG tablet TAKE 1 TABLET BY MOUTH EVERY DAY BEFORE BREAKFAST 90 tablet 0   glucose blood (ACCU-CHEK GUIDE) test strip Check fasting blood sugar daily. Dx: E11.9 100 each 3   hydrochlorothiazide (HYDRODIURIL) 25 MG tablet Take 1 tablet by mouth daily.     ibuprofen (ADVIL) 600 MG tablet Take 1 tablet (600 mg total) by mouth every 6 (six) hours as needed. 30 tablet 0   Lancets Misc. (ACCU-CHEK FASTCLIX LANCET) KIT Check fasting blood sugar daily. Dx: E11.9 1 kit 0   lisinopril (ZESTRIL) 40 MG tablet TAKE 1 TABLET BY MOUTH EVERY DAY 90 tablet 0   metFORMIN (GLUCOPHAGE) 1000 MG tablet TAKE 1 TABLET (1,000 MG TOTAL) BY MOUTH 2 (TWO) TIMES DAILY WITH A MEAL. 180 tablet 0   omeprazole (PRILOSEC) 20 MG capsule TAKE 1 CAPSULE BY MOUTH  EVERY DAY AS NEEDED 90 capsule 0   Accu-Chek FastClix Lancets MISC Check fasting blood sugar daily. Dx: E11.9 (Patient not taking: Reported on 09/15/2020) 102 each 2   No current facility-administered medications on file prior to visit.    No Known Allergies  Social History   Socioeconomic History   Marital status: Married    Spouse name: Not on file   Number of children: Not on file   Years of education: Not on file   Highest education level: Not on file  Occupational History   Not on file  Tobacco Use   Smoking status: Never   Smokeless  tobacco: Never  Substance and Sexual Activity   Alcohol use: No   Drug use: No   Sexual activity: Not Currently  Other Topics Concern   Not on file  Social History Narrative   Not on file   Social Determinants of Health   Financial Resource Strain: Not on file  Food Insecurity: Not on file  Transportation Needs: Not on file  Physical Activity: Not on file  Stress: Not on file  Social Connections: Not on file  Intimate Partner Violence: Not on file    Family History  Problem Relation Age of Onset   Breast cancer Daughter 56       And again 5 years later    History reviewed. No pertinent surgical history.  ROS: Review of Systems Negative except as stated above  PHYSICAL EXAM: BP 126/74 (BP Location: Left Arm, Patient Position: Sitting, Cuff Size: Normal)   Pulse 97   Temp 98.1 F (36.7 C)   Resp 15   Ht 5' 1.73" (1.568 m)   Wt 160 lb 4.8 oz (72.7 kg)   SpO2 95%   BMI 29.57 kg/m   Physical Exam General appearance - alert, well appearing, and in no distress and oriented to person, place, and time Mental status - alert, oriented to person, place, and time, normal mood, behavior, speech, dress, motor activity, and thought processes Neck - supple, no significant adenopathy Lymphatics - no palpable lymphadenopathy, no hepatosplenomegaly Chest - clear to auscultation, no wheezes, rales or rhonchi, symmetric air entry, no tachypnea, retractions or cyanosis Central lower sternum with localized area of swelling and tenderness without overlying erythema or warmth, no fluctuance or induration, area feels slightly firm  Heart - normal rate, regular rhythm, normal S1, S2, no murmurs, rubs, clicks or gallops Neurological - alert, oriented, normal speech, no focal findings or movement disorder noted  ASSESSMENT AND PLAN: 1. Localized swelling of chest wall: - Diagnostic x-ray of sternum on 09/10/2020 with no evidence of fracture or other focal bone lesions. - Last mammogram  11/07/2019. - Ultrasound chest soft tissue for further evaluation and management.  - Patient provided with a work note.  - [place] CHEST SOFT TISSUE; Future  2. Non-recurrent acute suppurative otitis media of right ear without spontaneous rupture of tympanic membrane: - Per patient request Amoxicillin-Clavulanate discontinued.  - Begin Cefdinir as prescribed.  - Follow-up as scheduled.  - cefdinir (OMNICEF) 300 MG capsule; Take 1 capsule (300 mg total) by mouth 2 (two) times daily for 7 days.  Dispense: 14 capsule; Refill: 0    Patient was given the opportunity to ask questions.  Patient verbalized understanding of the plan and was able to repeat key elements of the plan. Patient was given clear instructions to go to Emergency Department or return to medical center if symptoms don't improve, worsen, or new problems develop.The patient verbalized understanding.  Orders Placed This Encounter  Procedures   [place] CHEST SOFT TISSUE     Requested Prescriptions   Signed Prescriptions Disp Refills   cefdinir (OMNICEF) 300 MG capsule 14 capsule 0    Sig: Take 1 capsule (300 mg total) by mouth 2 (two) times daily for 7 days.    Follow-up as scheduled.   Camillia Herter, NP

## 2020-09-15 ENCOUNTER — Ambulatory Visit (INDEPENDENT_AMBULATORY_CARE_PROVIDER_SITE_OTHER): Payer: Medicaid Other | Admitting: Family

## 2020-09-15 ENCOUNTER — Encounter: Payer: Self-pay | Admitting: Family

## 2020-09-15 ENCOUNTER — Other Ambulatory Visit: Payer: Self-pay

## 2020-09-15 VITALS — BP 126/74 | HR 97 | Temp 98.1°F | Resp 15 | Ht 61.73 in | Wt 160.3 lb

## 2020-09-15 DIAGNOSIS — H66001 Acute suppurative otitis media without spontaneous rupture of ear drum, right ear: Secondary | ICD-10-CM | POA: Diagnosis not present

## 2020-09-15 DIAGNOSIS — R222 Localized swelling, mass and lump, trunk: Secondary | ICD-10-CM

## 2020-09-15 MED ORDER — CEFDINIR 300 MG PO CAPS: 300.0000 mg | ORAL_CAPSULE | Freq: Two times a day (BID) | ORAL | 0 refills | Status: AC

## 2020-09-15 NOTE — Progress Notes (Signed)
Pt presents for lump in chest at upper part of sternum noticed about 2 weeks ago symptoms include pain when wearing a bra and SOB feels like pressure in chest

## 2020-09-22 ENCOUNTER — Ambulatory Visit: Payer: Medicaid Other | Admitting: Family

## 2020-09-24 ENCOUNTER — Other Ambulatory Visit: Payer: Self-pay

## 2020-09-24 ENCOUNTER — Ambulatory Visit (HOSPITAL_COMMUNITY)
Admission: RE | Admit: 2020-09-24 | Discharge: 2020-09-24 | Disposition: A | Discharge status: Home / Self Care | Payer: Medicaid Other | Source: Ambulatory Visit | Attending: Family | Admitting: Family

## 2020-09-24 DIAGNOSIS — R222 Localized swelling, mass and lump, trunk: Secondary | ICD-10-CM | POA: Insufficient documentation

## 2020-09-25 NOTE — Progress Notes (Signed)
Normal chest ultrasound.

## 2020-09-28 ENCOUNTER — Telehealth: Payer: Self-pay | Admitting: Family

## 2020-09-28 NOTE — Telephone Encounter (Signed)
Pt's daughter calling regarding an [place] from Friday 09/24/20. Pt viewed results on mychart but does not understand what that means and is asking if she  needs further testing. Please advise and thank you.

## 2020-09-29 NOTE — Telephone Encounter (Signed)
Ultrasound results and result note from provider reviewed by patient through [place] normal

## 2020-10-11 NOTE — Progress Notes (Signed)
Patient ID: Stacy Torres, female    DOB: [birth year]  MRN: [id number]  CC: Diabetes Follow-Up  Subjective: Stacy Torres is a 60 y.o. female who presents for diabetes follow-up.   Her concerns today include:   DIABETES TYPE 2 FOLLOW-UP: 06/16/2020 per DO note: A1c worsened from 6.2>7.8. Patient reports compliance with her medications. However, thinks this is likely due to dietary indiscretions. Discussed appropriate DM diet and need for regular exercise. Continue current regimen with lifestyle changes and monitor in 3 months with repeat A1c.   10/12/2020: Doing well on current regimen. No side effects. No issues/concerns. In addition to reports her blood pressure is higher than normal related to removal of tooth from dentist recently and as a result she is in pain.   2. ULTRASOUND CHEST FOLLOW-UP: 09/24/2020: Prominent sternal xiphoid junction corresponding to the palpable abnormality. No sonographic findings.  10/12/2020: Patient accompanied by her daughter, Stacy Torres. Patient and daughter have concerns for continued intermittent shortness of breath, chest discomfort, and fluctuation of size of chest lump without clear explanation. Concerned for something more such as cancer. Daughter specifically mentioned breast cancer. Would like to know if patient needs a workup for cancer. Patient's daughter endorses that she personally had concerns similar to this in the past and doesn't want anything to be brushed to the side. They would like to know next steps. They would like to know if this will go away and what patient can do for treatment. Reports patient is still currently employed.   Daughter reports she did see the provider comment on 10/06/2020 in MyChart which stated the utlrasound was normal. Reports concerns with the ultrasound explanation of findings specifically the statement of "abnormality". Reports when she saw the word abnormality she did not understand how the chest ultrasound  could be normal.    Patient Active Problem List   Diagnosis Date Noted   Depression 04/17/2016   Psoriasis 02/03/2015   Obesity (BMI 30-39.9) 03/21/2012   GERD (gastroesophageal reflux disease) 01/13/2012   Hyperlipidemia 06/10/2011   DM (diabetes mellitus) ([place]) 06/09/2011   HTN (hypertension) 06/09/2011     Current Outpatient Medications on File Prior to Visit  Medication Sig Dispense Refill   Accu-Chek FastClix Lancets MISC Check fasting blood sugar daily. Dx: E11.9 (Patient not taking: Reported on 09/15/2020) 102 each 2   atenolol (TENORMIN) 25 MG tablet Take 1 tablet (25 mg total) by mouth daily. 180 tablet 0   atorvastatin (LIPITOR) 10 MG tablet TAKE 4 TABLETS BY MOUTH DAILY 90 tablet 1   blood glucose meter kit and supplies KIT Dispense based on patient and insurance preference. Use up to BID  times daily as directed to check blood sugar. E11.89 1 each 0   Blood Pressure KIT 1 Device by Does not apply route daily. 1 kit 0   clotrimazole-betamethasone (LOTRISONE) cream APPLY TO AFFECTED AREA TWICE A DAY 30 g 0   glipiZIDE (GLUCOTROL) 5 MG tablet TAKE 1 TABLET BY MOUTH EVERY DAY BEFORE BREAKFAST 90 tablet 0   glucose blood (ACCU-CHEK GUIDE) test strip Check fasting blood sugar daily. Dx: E11.9 100 each 3   hydrochlorothiazide (HYDRODIURIL) 25 MG tablet Take 1 tablet by mouth daily.     ibuprofen (ADVIL) 600 MG tablet Take 1 tablet (600 mg total) by mouth every 6 (six) hours as needed. 30 tablet 0   Lancets Misc. (ACCU-CHEK FASTCLIX LANCET) KIT Check fasting blood sugar daily. Dx: E11.9 1 kit 0   lisinopril (ZESTRIL) 40 MG tablet TAKE  1 TABLET BY MOUTH EVERY DAY 90 tablet 0   metFORMIN (GLUCOPHAGE) 1000 MG tablet TAKE 1 TABLET (1,000 MG TOTAL) BY MOUTH 2 (TWO) TIMES DAILY WITH A MEAL. 180 tablet 0   omeprazole (PRILOSEC) 20 MG capsule TAKE 1 CAPSULE BY MOUTH EVERY DAY AS NEEDED 90 capsule 0   No current facility-administered medications on file prior to visit.    No Known  Allergies  Social History   Socioeconomic History   Marital status: Legally Separated    Spouse name: Not on file   Number of children: Not on file   Years of education: Not on file   Highest education level: Not on file  Occupational History   Not on file  Tobacco Use   Smoking status: Never   Smokeless tobacco: Never  Substance and Sexual Activity   Alcohol use: No   Drug use: No   Sexual activity: Not Currently  Other Topics Concern   Not on file  Social History Narrative   Not on file   Social Determinants of Health   Financial Resource Strain: Not on file  Food Insecurity: Not on file  Transportation Needs: Not on file  Physical Activity: Not on file  Stress: Not on file  Social Connections: Not on file  Intimate Partner Violence: Not on file    Family History  Problem Relation Age of Onset   Breast cancer Daughter 76       And again 5 years later    History reviewed. No pertinent surgical history.  ROS: Review of Systems Negative except as stated above  PHYSICAL EXAM: BP (!) 152/80 (BP Location: Left Arm, Patient Position: Sitting, Cuff Size: Normal)   Pulse 76   Temp 97.9 F (36.6 C)   Resp 18   Ht 5' 1.73" (1.568 m)   Wt 163 lb (73.9 kg)   SpO2 95%   BMI 30.07 kg/m   Physical Exam HENT:     Head: Normocephalic and atraumatic.  Eyes:     Extraocular Movements: Extraocular movements intact.     Conjunctiva/sclera: Conjunctivae normal.     Pupils: Pupils are equal, round, and reactive to light.  Cardiovascular:     Rate and Rhythm: Normal rate and regular rhythm.     Pulses: Normal pulses.     Heart sounds: Normal heart sounds.  Pulmonary:     Effort: Pulmonary effort is normal.     Breath sounds: Normal breath sounds.  Chest:     Comments: Central lower sternum with localized area of swelling and tenderness without overlying erythema or warmth, no fluctuance or induration, area feels slightly firm. Similar in presentation to 09/15/2020  visit. Musculoskeletal:     Cervical back: Normal range of motion and neck supple.  Neurological:     General: No focal deficit present.     Mental Status: She is alert and oriented to person, place, and time.  Psychiatric:        Mood and Affect: Mood normal.        Behavior: Behavior normal.   ASSESSMENT AND PLAN: 1. Type 2 diabetes mellitus without complication, without long-term current use of insulin ([place]): - Hemoglobin A1c at goal today at 6.6%, goal < 7%. This is improved from previous hemoglobin A1c of 7.8% on 06/16/2020 next hemoglobin A1c due October 2022.  - Continue Metformin and Glipizide as prescribed.  - Discussed the importance of healthy eating habits, low-carbohydrate diet, low-sugar diet, regular aerobic exercise (at least 150 minutes a week  as tolerated) and medication compliance to achieve or maintain control of diabetes. - BMP to evaluate kidney function and electrolyte balance. - Follow-up with primary provider in 3 months or sooner if needed.  - POCT glycosylated hemoglobin (Hb A1C)  2. Localized swelling of chest wall: 3. Prominent xiphoid: - Initial visit on 09/10/2020 at [facility] at [facility] for localized swelling of chest wall. On the same date diagnostic chest x-ray negative for any bony lesions, exam not suggestive of abscess, possible underlying cyst versus other soft tissue swelling and recommended anti-inflammatories and warm compresses. Patient to follow-up with primary care to consider ultrasounding if symptoms persisting to evaluate further nature of swelling.  - Subsequent visit on 09/15/2020 at [place] at [facility] with continued symptoms. Patient initially noticed the lump about 2 weeks prior when checking breasts at home as she normally does. Reported she works in a [employer]. Within minutes of working the lump in the middle of her chest changes sizes from walnut-sized to the size of an egg. With rest the lump  decreases. Endorsed pain, taking Ibuprofen to help. Endorsed shortness of breath and chest pain with exertion. Denied drainage. She is a non-smoker. Last mammogram November 07, 2019 without evidence of malignancy or lesion with recommendation to repeat in 1 year. - Ultrasound chest soft tissue on 09/25/2020 resulted with prominent sternal xiphoid junction corresponding to the palpable abnormality. No sonographic findings. No fluid collections.  - Patient present today in office accompanied by her daughter, Stacy Torres, with concern regarding the next steps and more specifically the notation of the term abnormality in the description of findings on the ultrasound of chest soft tissue report. Patient/family provided with a printed copy of report as requested.  - Counseled patient and family that the palpable abnormality is related to the prominent xiphoid and no further imaging is recommended at present. However, will proceed with further workup per patient and family preference.  - Counseled to continue anti-inflammatories, warm compresses, and rest. Offered patient a work note and she declined.  - In regards to patient and family concerns of possible cancer offered referral to Oncology for detailed workup and they declined.  - Referral to Cardiothoracic Surgery for further evaluation and management.  - Follow-up with primary care as scheduled.  - Ambulatory referral to Cardiothoracic Surgery   Patient was given the opportunity to ask questions.  Patient verbalized understanding of the plan and was able to repeat key elements of the plan. Patient was given clear instructions to go to Emergency Department or return to medical center if symptoms don't improve, worsen, or new problems develop.The patient verbalized understanding.   Orders Placed This Encounter  Procedures   Basic Metabolic Panel   POCT glycosylated hemoglobin (Hb A1C)     Stacy Scarpelli Zachery Dauer, NP

## 2020-10-12 ENCOUNTER — Encounter: Payer: Self-pay | Admitting: Family

## 2020-10-12 ENCOUNTER — Other Ambulatory Visit: Payer: Self-pay

## 2020-10-12 ENCOUNTER — Ambulatory Visit (INDEPENDENT_AMBULATORY_CARE_PROVIDER_SITE_OTHER): Payer: Medicaid Other | Admitting: Family

## 2020-10-12 VITALS — BP 152/80 | HR 76 | Temp 97.9°F | Resp 18 | Ht 61.73 in | Wt 163.0 lb

## 2020-10-12 DIAGNOSIS — E119 Type 2 diabetes mellitus without complications: Secondary | ICD-10-CM

## 2020-10-12 DIAGNOSIS — R29898 Other symptoms and signs involving the musculoskeletal system: Secondary | ICD-10-CM

## 2020-10-12 DIAGNOSIS — R222 Localized swelling, mass and lump, trunk: Secondary | ICD-10-CM

## 2020-10-12 LAB — POCT GLYCOSYLATED HEMOGLOBIN (HGB A1C): Hemoglobin A1C: 6.6 % — AB (ref 4.0–5.6)

## 2020-10-12 NOTE — Progress Notes (Signed)
Pt presents for diabetes follow-up.  Pt states although chest ultrasound is normal, she is still experiencing pain at the area and the lump seems to be growing in size

## 2020-11-19 ENCOUNTER — Other Ambulatory Visit: Payer: Self-pay

## 2020-11-19 DIAGNOSIS — I10 Essential (primary) hypertension: Secondary | ICD-10-CM

## 2020-11-19 MED ORDER — LISINOPRIL 40 MG PO TABS: 40.0000 mg | ORAL_TABLET | Freq: Every day | ORAL | 0 refills | Status: DC

## 2020-12-13 ENCOUNTER — Other Ambulatory Visit: Payer: Self-pay

## 2020-12-13 DIAGNOSIS — E7849 Other hyperlipidemia: Secondary | ICD-10-CM

## 2020-12-13 MED ORDER — ATORVASTATIN CALCIUM 40 MG PO TABS: 40.0000 mg | ORAL_TABLET | Freq: Every day | ORAL | 0 refills | Status: DC

## 2020-12-13 NOTE — Progress Notes (Signed)
Atorvastatin refilled.  

## 2020-12-17 ENCOUNTER — Other Ambulatory Visit: Payer: Self-pay

## 2020-12-17 DIAGNOSIS — K219 Gastro-esophageal reflux disease without esophagitis: Secondary | ICD-10-CM

## 2020-12-17 MED ORDER — OMEPRAZOLE 20 MG PO CPDR: DELAYED_RELEASE_CAPSULE | ORAL | 0 refills | Status: DC

## 2021-01-07 ENCOUNTER — Other Ambulatory Visit: Payer: Self-pay | Admitting: *Deleted

## 2021-01-07 DIAGNOSIS — E119 Type 2 diabetes mellitus without complications: Secondary | ICD-10-CM

## 2021-01-07 MED ORDER — METFORMIN HCL 1000 MG PO TABS: 1000.0000 mg | ORAL_TABLET | Freq: Two times a day (BID) | ORAL | 0 refills | Status: DC

## 2021-01-13 ENCOUNTER — Other Ambulatory Visit: Payer: Self-pay

## 2021-01-13 ENCOUNTER — Ambulatory Visit: Payer: Medicaid Other | Admitting: Family Medicine

## 2021-01-13 VITALS — BP 147/76 | HR 76 | Temp 98.3°F | Resp 16 | Ht 60.0 in | Wt 153.2 lb

## 2021-01-13 DIAGNOSIS — B370 Candidal stomatitis: Secondary | ICD-10-CM | POA: Diagnosis not present

## 2021-01-13 DIAGNOSIS — E119 Type 2 diabetes mellitus without complications: Secondary | ICD-10-CM | POA: Diagnosis not present

## 2021-01-13 DIAGNOSIS — E7849 Other hyperlipidemia: Secondary | ICD-10-CM | POA: Diagnosis not present

## 2021-01-13 DIAGNOSIS — I10 Essential (primary) hypertension: Secondary | ICD-10-CM

## 2021-01-13 LAB — POCT GLYCOSYLATED HEMOGLOBIN (HGB A1C): Hemoglobin A1C: 7 % — AB (ref 4.0–5.6)

## 2021-01-13 MED ORDER — FLUCONAZOLE 100 MG PO TABS: ORAL_TABLET | ORAL | 0 refills | Status: DC

## 2021-01-13 NOTE — Progress Notes (Signed)
Patient is here for f/up DM  Patient is having and outbreak on her tongue that looks to be thrash x 1 month

## 2021-01-13 NOTE — Progress Notes (Signed)
New Patient Office Visit  Subjective:  Patient ID: Stacy Torres, female    DOB: [birth year]  Age: 60 y.o. MRN: [id number]  CC:  Chief Complaint  Patient presents with   Diabetes    HPI Stacy Torres presents for routine follow up of chronic med issues including diabetes and hypertension. Patient also reports white spots on her tongue that are painful. Denies recent abx or steroids.   Past Medical History:  Diagnosis Date   Anxiety    Phreesia 10/23/2019   Arthritis    Phreesia 10/23/2019   Diabetes mellitus without complication ([place])    Hyperlipidemia    Phreesia 10/23/2019   Hypertension       Social History   Socioeconomic History   Marital status: Legally Separated    Spouse name: Not on file   Number of children: Not on file   Years of education: Not on file   Highest education level: Not on file  Occupational History   Not on file  Tobacco Use   Smoking status: Never   Smokeless tobacco: Never  Substance and Sexual Activity   Alcohol use: No   Drug use: No   Sexual activity: Not Currently  Other Topics Concern   Not on file  Social History Narrative   Not on file   Social Determinants of Health   Financial Resource Strain: Not on file  Food Insecurity: Not on file  Transportation Needs: Not on file  Physical Activity: Not on file  Stress: Not on file  Social Connections: Not on file  Intimate Partner Violence: Not on file    ROS Review of Systems  Constitutional:  Negative for fever.  HENT:  Positive for mouth sores and trouble swallowing.   All other systems reviewed and are negative.  Objective:   Today's Vitals: BP (!) 147/76   Pulse 76   Temp 98.3 F (36.8 C) (Oral)   Resp 16   Ht 5' (1.524 m)   Wt 153 lb 3.2 oz (69.5 kg)   SpO2 95%   BMI 29.92 kg/m   Physical Exam Vitals and nursing note reviewed.  Constitutional:      General: She is not in acute distress. HENT:     Mouth/Throat:     Mouth: Mucous membranes  are moist. Oral lesions present.     Tongue: Lesions present.  Cardiovascular:     Rate and Rhythm: Normal rate and regular rhythm.  Pulmonary:     Effort: Pulmonary effort is normal.     Breath sounds: Normal breath sounds.  Abdominal:     Palpations: Abdomen is soft.     Tenderness: There is no abdominal tenderness.  Musculoskeletal:     Right lower leg: No edema.     Left lower leg: No edema.  Neurological:     General: No focal deficit present.     Mental Status: She is alert and oriented to person, place, and time.    Assessment & Plan:   1. Essential hypertension Slightly elevated reading. Will continue present management and monitor.   2. Type 2 diabetes mellitus without complication, without long-term current use of insulin ([facility]) HgbA1c right at goal. Discussed dietary and activity options. Continue present management - POCT glycosylated hemoglobin (Hb A1C)  3. Thrush Diflucan prescribed. - fluconazole (DIFLUCAN) 100 MG tablet; 2 po day one then one daily for a total of 7 days.  Dispense: 8 tablet; Refill: 0  4. Other hyperlipidemia Continue present management  Outpatient Encounter Medications as of 01/13/2021  Medication Sig   Accu-Chek FastClix Lancets MISC Check fasting blood sugar daily. Dx: E11.9   atenolol (TENORMIN) 25 MG tablet Take 1 tablet (25 mg total) by mouth daily.   atorvastatin (LIPITOR) 40 MG tablet Take 1 tablet (40 mg total) by mouth daily.   blood glucose meter kit and supplies KIT Dispense based on patient and insurance preference. Use up to BID  times daily as directed to check blood sugar. E11.89   clotrimazole-betamethasone (LOTRISONE) cream APPLY TO AFFECTED AREA TWICE A DAY   glipiZIDE (GLUCOTROL) 5 MG tablet TAKE 1 TABLET BY MOUTH EVERY DAY BEFORE BREAKFAST   glucose blood (ACCU-CHEK GUIDE) test strip Check fasting blood sugar daily. Dx: E11.9   ibuprofen (ADVIL) 600 MG tablet Take 1 tablet (600 mg total) by mouth every 6 (six) hours as  needed.   Lancets Misc. (ACCU-CHEK FASTCLIX LANCET) KIT Check fasting blood sugar daily. Dx: E11.9   lisinopril (ZESTRIL) 40 MG tablet Take 1 tablet (40 mg total) by mouth daily.   metFORMIN (GLUCOPHAGE) 1000 MG tablet Take 1 tablet (1,000 mg total) by mouth 2 (two) times daily with a meal.   omeprazole (PRILOSEC) 20 MG capsule TAKE 1 CAPSULE BY MOUTH EVERY DAY AS NEEDED   Blood Pressure KIT 1 Device by Does not apply route daily.   hydrochlorothiazide (HYDRODIURIL) 25 MG tablet Take 1 tablet by mouth daily.   No facility-administered encounter medications on file as of 01/13/2021.    Follow-up: Return in about 3 months (around 04/15/2021) for follow up.   Becky Sax, MD

## 2021-01-14 ENCOUNTER — Encounter: Payer: Self-pay | Admitting: Family Medicine

## 2021-02-11 ENCOUNTER — Other Ambulatory Visit: Payer: Self-pay | Admitting: *Deleted

## 2021-02-11 ENCOUNTER — Other Ambulatory Visit: Payer: Self-pay | Admitting: Internal Medicine

## 2021-02-11 DIAGNOSIS — I10 Essential (primary) hypertension: Secondary | ICD-10-CM

## 2021-02-11 MED ORDER — ATENOLOL 25 MG PO TABS: 25.0000 mg | ORAL_TABLET | Freq: Every day | ORAL | 0 refills | Status: DC

## 2021-02-21 ENCOUNTER — Other Ambulatory Visit: Payer: Self-pay | Admitting: Internal Medicine

## 2021-02-21 DIAGNOSIS — I10 Essential (primary) hypertension: Secondary | ICD-10-CM

## 2021-03-09 ENCOUNTER — Other Ambulatory Visit: Payer: Self-pay | Admitting: Family

## 2021-03-09 DIAGNOSIS — E7849 Other hyperlipidemia: Secondary | ICD-10-CM

## 2021-04-11 ENCOUNTER — Other Ambulatory Visit: Payer: Self-pay | Admitting: Family

## 2021-04-11 DIAGNOSIS — E119 Type 2 diabetes mellitus without complications: Secondary | ICD-10-CM

## 2021-05-20 ENCOUNTER — Other Ambulatory Visit: Payer: Self-pay

## 2021-05-20 DIAGNOSIS — I10 Essential (primary) hypertension: Secondary | ICD-10-CM

## 2021-05-20 DIAGNOSIS — K219 Gastro-esophageal reflux disease without esophagitis: Secondary | ICD-10-CM

## 2021-05-20 DIAGNOSIS — E119 Type 2 diabetes mellitus without complications: Secondary | ICD-10-CM

## 2021-05-20 MED ORDER — OMEPRAZOLE 20 MG PO CPDR: DELAYED_RELEASE_CAPSULE | ORAL | 0 refills | Status: DC

## 2021-05-20 MED ORDER — LISINOPRIL 40 MG PO TABS: 40.0000 mg | ORAL_TABLET | Freq: Every day | ORAL | 0 refills | Status: DC

## 2021-05-20 MED ORDER — GLIPIZIDE 5 MG PO TABS
ORAL_TABLET | ORAL | 0 refills | Status: DC
Start: 2021-05-20 — End: 2021-06-02

## 2021-05-25 ENCOUNTER — Ambulatory Visit: Payer: Medicaid Other | Admitting: Family Medicine

## 2021-06-02 ENCOUNTER — Other Ambulatory Visit: Payer: Self-pay

## 2021-06-02 ENCOUNTER — Ambulatory Visit: Payer: Medicaid Other | Admitting: Family Medicine

## 2021-06-02 ENCOUNTER — Encounter: Payer: Self-pay | Admitting: Family Medicine

## 2021-06-02 VITALS — BP 162/75 | HR 72 | Temp 98.1°F | Resp 16 | Wt 162.2 lb

## 2021-06-02 DIAGNOSIS — E119 Type 2 diabetes mellitus without complications: Secondary | ICD-10-CM

## 2021-06-02 DIAGNOSIS — I10 Essential (primary) hypertension: Secondary | ICD-10-CM

## 2021-06-02 DIAGNOSIS — E7849 Other hyperlipidemia: Secondary | ICD-10-CM | POA: Diagnosis not present

## 2021-06-02 DIAGNOSIS — K219 Gastro-esophageal reflux disease without esophagitis: Secondary | ICD-10-CM | POA: Diagnosis not present

## 2021-06-02 LAB — POCT GLYCOSYLATED HEMOGLOBIN (HGB A1C): Hemoglobin A1C: 8.2 % — AB (ref 4.0–5.6)

## 2021-06-02 MED ORDER — GLIPIZIDE 10 MG PO TABS: 10.0000 mg | ORAL_TABLET | Freq: Every day | ORAL | 1 refills | Status: DC

## 2021-06-02 MED ORDER — OMEPRAZOLE 20 MG PO CPDR: DELAYED_RELEASE_CAPSULE | ORAL | 1 refills | Status: DC

## 2021-06-02 MED ORDER — LISINOPRIL 40 MG PO TABS: 40.0000 mg | ORAL_TABLET | Freq: Every day | ORAL | 1 refills | Status: DC

## 2021-06-02 MED ORDER — METFORMIN HCL 1000 MG PO TABS: 1000.0000 mg | ORAL_TABLET | Freq: Two times a day (BID) | ORAL | 1 refills | Status: DC

## 2021-06-02 MED ORDER — ATENOLOL 50 MG PO TABS: 50.0000 mg | ORAL_TABLET | Freq: Every day | ORAL | 0 refills | Status: DC

## 2021-06-02 MED ORDER — ATORVASTATIN CALCIUM 40 MG PO TABS: 40.0000 mg | ORAL_TABLET | Freq: Every day | ORAL | 1 refills | Status: DC

## 2021-06-02 MED ORDER — HYDROCHLOROTHIAZIDE 25 MG PO TABS: 25.0000 mg | ORAL_TABLET | Freq: Every day | ORAL | 1 refills | Status: DC

## 2021-06-02 NOTE — Progress Notes (Signed)
Patient is here for follow-up HTN,DMII ? ?Patient said that she has been feeling very good lately . No concerns ?

## 2021-06-02 NOTE — Progress Notes (Signed)
? ?Established Patient Office Visit ? ?Subjective:  ?Patient ID: Stacy Torres, female    DOB: 07-18-1960  Age: 61 y.o. MRN: [id number] ? ?CC:  ?Chief Complaint  ?Patient presents with  ? Follow-up  ? Hypertension  ? Diabetes  ? Medication Refill  ? ? ?HPI ?Stacy Torres presents for routine follow up of chronic med issues including diabetes and hypertension. Patient denies acute complaints or concerns.  ? ?Past Medical History:  ?Diagnosis Date  ? Anxiety   ? Phreesia 10/23/2019  ? Arthritis   ? Phreesia 10/23/2019  ? Diabetes mellitus without complication ([place])   ? Hyperlipidemia   ? Phreesia 10/23/2019  ? Hypertension   ? ? ?History reviewed. No pertinent surgical history. ? ?Family History  ?Problem Relation Age of Onset  ? Breast cancer Daughter 4  ?     And again 5 years later  ? ? ?Social History  ? ?Socioeconomic History  ? Marital status: Legally Separated  ?  Spouse name: Not on file  ? Number of children: Not on file  ? Years of education: Not on file  ? Highest education level: Not on file  ?Occupational History  ? Not on file  ?Tobacco Use  ? Smoking status: Never  ? Smokeless tobacco: Never  ?Substance and Sexual Activity  ? Alcohol use: No  ? Drug use: No  ? Sexual activity: Not Currently  ?Other Topics Concern  ? Not on file  ?Social History Narrative  ? Not on file  ? ?Social Determinants of Health  ? ?Financial Resource Strain: Not on file  ?Food Insecurity: Not on file  ?Transportation Needs: Not on file  ?Physical Activity: Not on file  ?Stress: Not on file  ?Social Connections: Not on file  ?Intimate Partner Violence: Not on file  ? ? ?ROS ?Review of Systems  ?All other systems reviewed and are negative. ? ?Objective:  ? ?Today's Vitals: BP (!) 162/75   Pulse 72   Temp 98.1 ?F (36.7 ?C) (Oral)   Resp 16   Wt 162 lb 3.7 oz (73.6 kg)   SpO2 99%   BMI 31.68 kg/m?  ? ?Physical Exam ?Vitals and nursing note reviewed.  ?Constitutional:   ?   General: She is not in acute  distress. ?Cardiovascular:  ?   Rate and Rhythm: Normal rate and regular rhythm.  ?Pulmonary:  ?   Effort: Pulmonary effort is normal.  ?   Breath sounds: Normal breath sounds.  ?Abdominal:  ?   Palpations: Abdomen is soft.  ?   Tenderness: There is no abdominal tenderness.  ?Musculoskeletal:  ?   Right lower leg: No edema.  ?   Left lower leg: No edema.  ?Neurological:  ?   General: No focal deficit present.  ?   Mental Status: She is alert and oriented to person, place, and time.  ? ? ?Assessment & Plan:  ? ?1. Type 2 diabetes mellitus without complication, without long-term current use of insulin ([place]) ?Increasing A1c and not near goal. Glipizide increased from 5 mg to 10 mg daily. Will refer to [facility] for diabetic med management. monitor ?- POCT glycosylated hemoglobin (Hb A1C) ?- metFORMIN (GLUCOPHAGE) 1000 MG tablet; Take 1 tablet (1,000 mg total) by mouth 2 (two) times daily with a meal.  Dispense: 180 tablet; Refill: 1 ? ?2. Essential hypertension ?Elevated reading. Compliance discussed. Will increase atenolol from 25 mg to 50 mg.  ?- lisinopril (ZESTRIL) 40 MG tablet; Take 1 tablet (40 mg total)  by mouth daily.  Dispense: 90 tablet; Refill: 1 ? ?3. Gastroesophageal reflux disease without esophagitis ?Continue present management. Meds refilled.  ?- omeprazole (PRILOSEC) 20 MG capsule; TAKE 1 CAPSULE BY MOUTH EVERY DAY AS NEEDED  Dispense: 90 capsule; Refill: 1 ? ?4. Other hyperlipidemia ?Continue present management. Meds refilled.  ?- atorvastatin (LIPITOR) 40 MG tablet; Take 1 tablet (40 mg total) by mouth daily.  Dispense: 90 tablet; Refill: 1 ? ? ? ?Outpatient Encounter Medications as of 06/02/2021  ?Medication Sig  ? Accu-Chek FastClix Lancets MISC Check fasting blood sugar daily. Dx: E11.9  ? atorvastatin (LIPITOR) 40 MG tablet TAKE 1 TABLET BY MOUTH EVERY DAY  ? blood glucose meter kit and supplies KIT Dispense based on patient and insurance preference. Use up to BID  times daily as directed to check blood  sugar. E11.89  ? clotrimazole-betamethasone (LOTRISONE) cream APPLY TO AFFECTED AREA TWICE A DAY  ? glipiZIDE (GLUCOTROL) 5 MG tablet TAKE 1 TABLET BY MOUTH EVERY DAY BEFORE BREAKFAST  ? glucose blood (ACCU-CHEK GUIDE) test strip Check fasting blood sugar daily. Dx: E11.9  ? hydrochlorothiazide (HYDRODIURIL) 25 MG tablet Take 25 mg by mouth daily.  ? Lancets Misc. (ACCU-CHEK FASTCLIX LANCET) KIT Check fasting blood sugar daily. Dx: E11.9  ? lisinopril (ZESTRIL) 40 MG tablet Take 1 tablet (40 mg total) by mouth daily.  ? metFORMIN (GLUCOPHAGE) 1000 MG tablet TAKE 1 TABLET (1,000 MG TOTAL) BY MOUTH 2 (TWO) TIMES DAILY WITH A MEAL.  ? omeprazole (PRILOSEC) 20 MG capsule TAKE 1 CAPSULE BY MOUTH EVERY DAY AS NEEDED  ? [DISCONTINUED] atenolol (TENORMIN) 25 MG tablet Take 1 tablet (25 mg total) by mouth daily.  ? [DISCONTINUED] fluconazole (DIFLUCAN) 100 MG tablet 2 po day one then one daily for a total of 7 days.  ? [DISCONTINUED] ibuprofen (ADVIL) 600 MG tablet Take 1 tablet (600 mg total) by mouth every 6 (six) hours as needed.  ? ?No facility-administered encounter medications on file as of 06/02/2021.  ? ? ?Follow-up: Return in about 3 months (around 09/02/2021) for follow up, chronic med issues.  ? ?Becky Sax, MD ? ?

## 2021-07-26 ENCOUNTER — Encounter: Payer: Self-pay | Admitting: Family Medicine

## 2021-11-15 IMAGING — MG DIGITAL SCREENING BILAT W/ CAD
4 series · 4 of 4 positions shown · non-contrast
Comparison: None.

CLINICAL DATA: Screening.

EXAM:
DIGITAL SCREENING BILATERAL MAMMOGRAM WITH CAD

[R MLO]
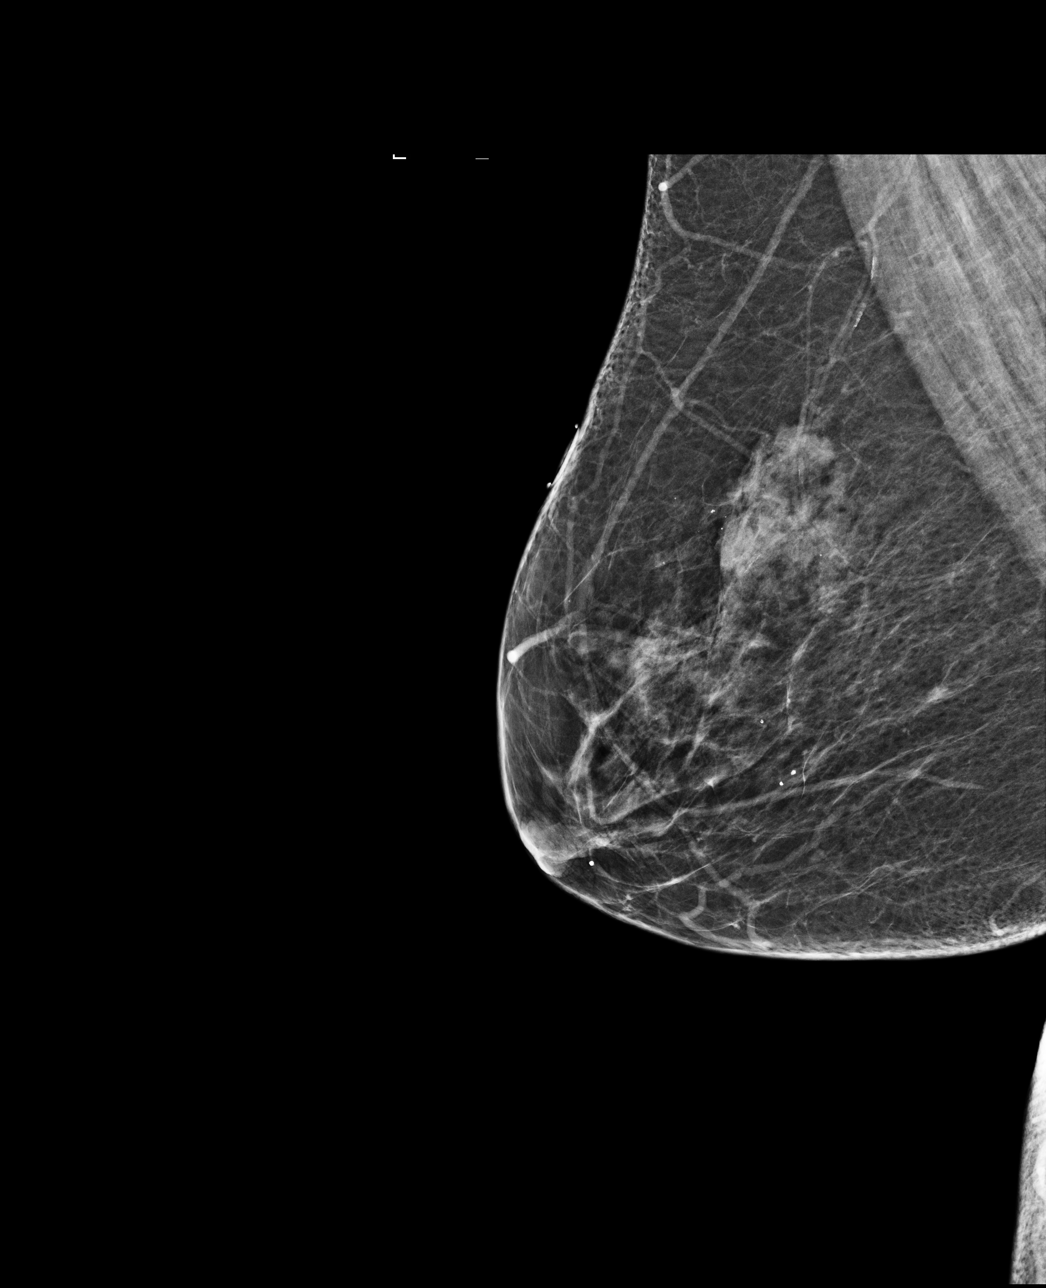

[L MLO]
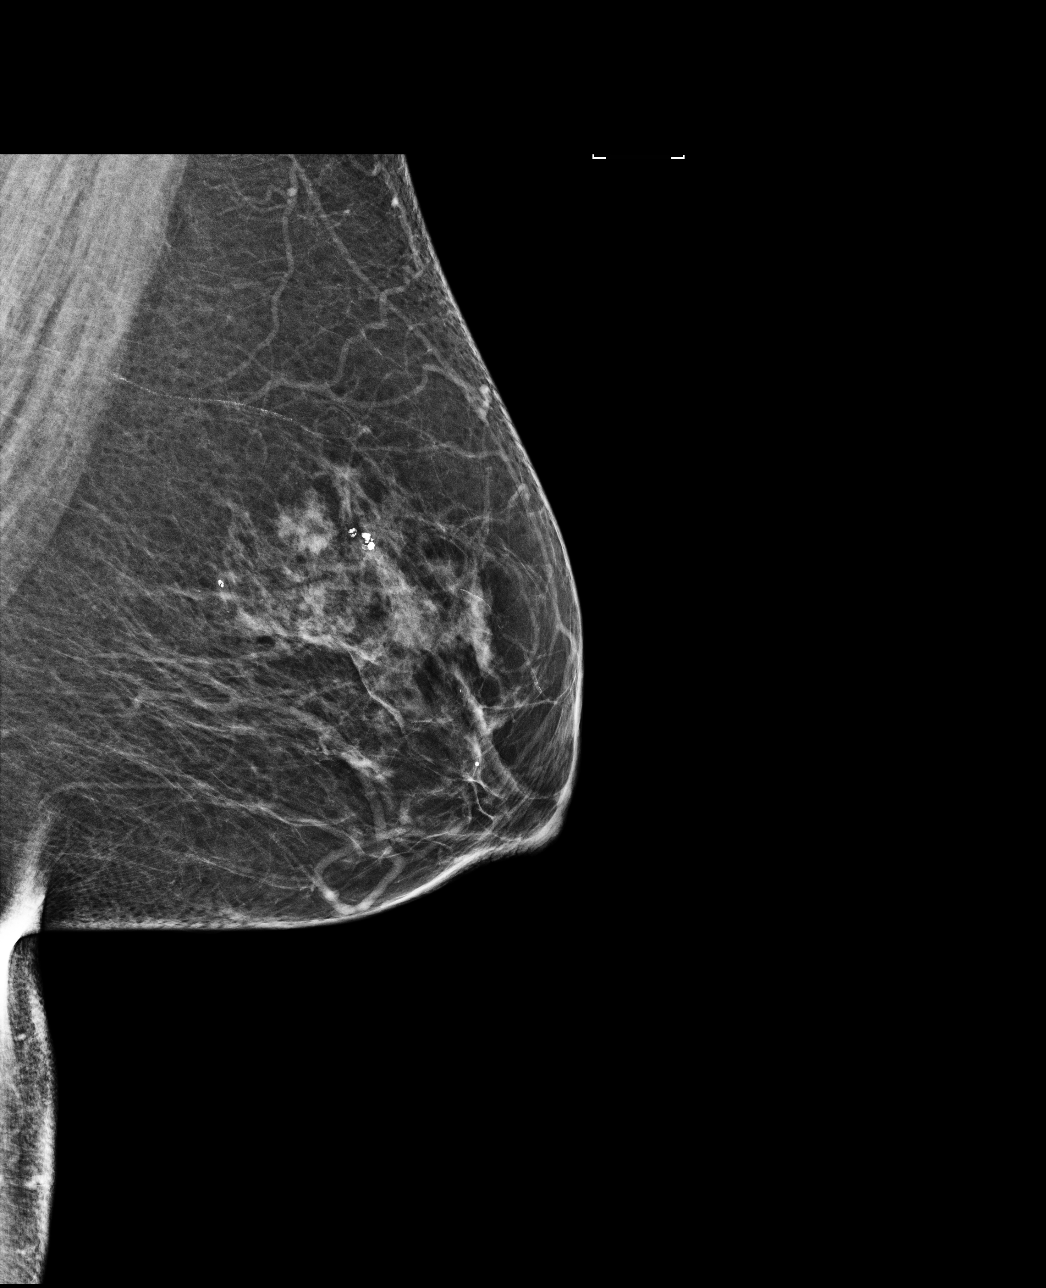

[L CC]
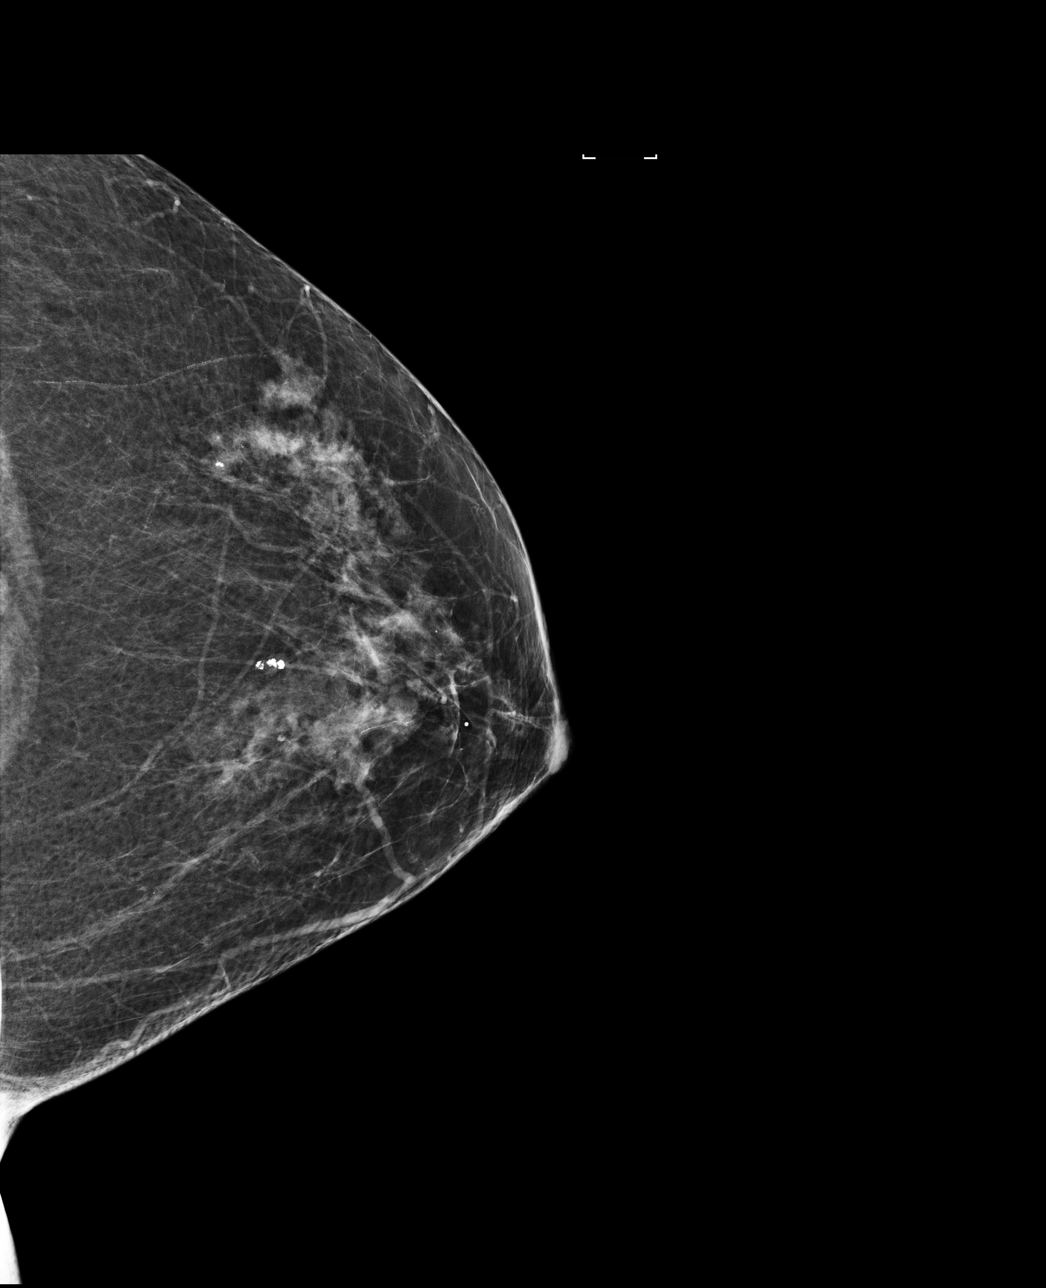

[R CC]
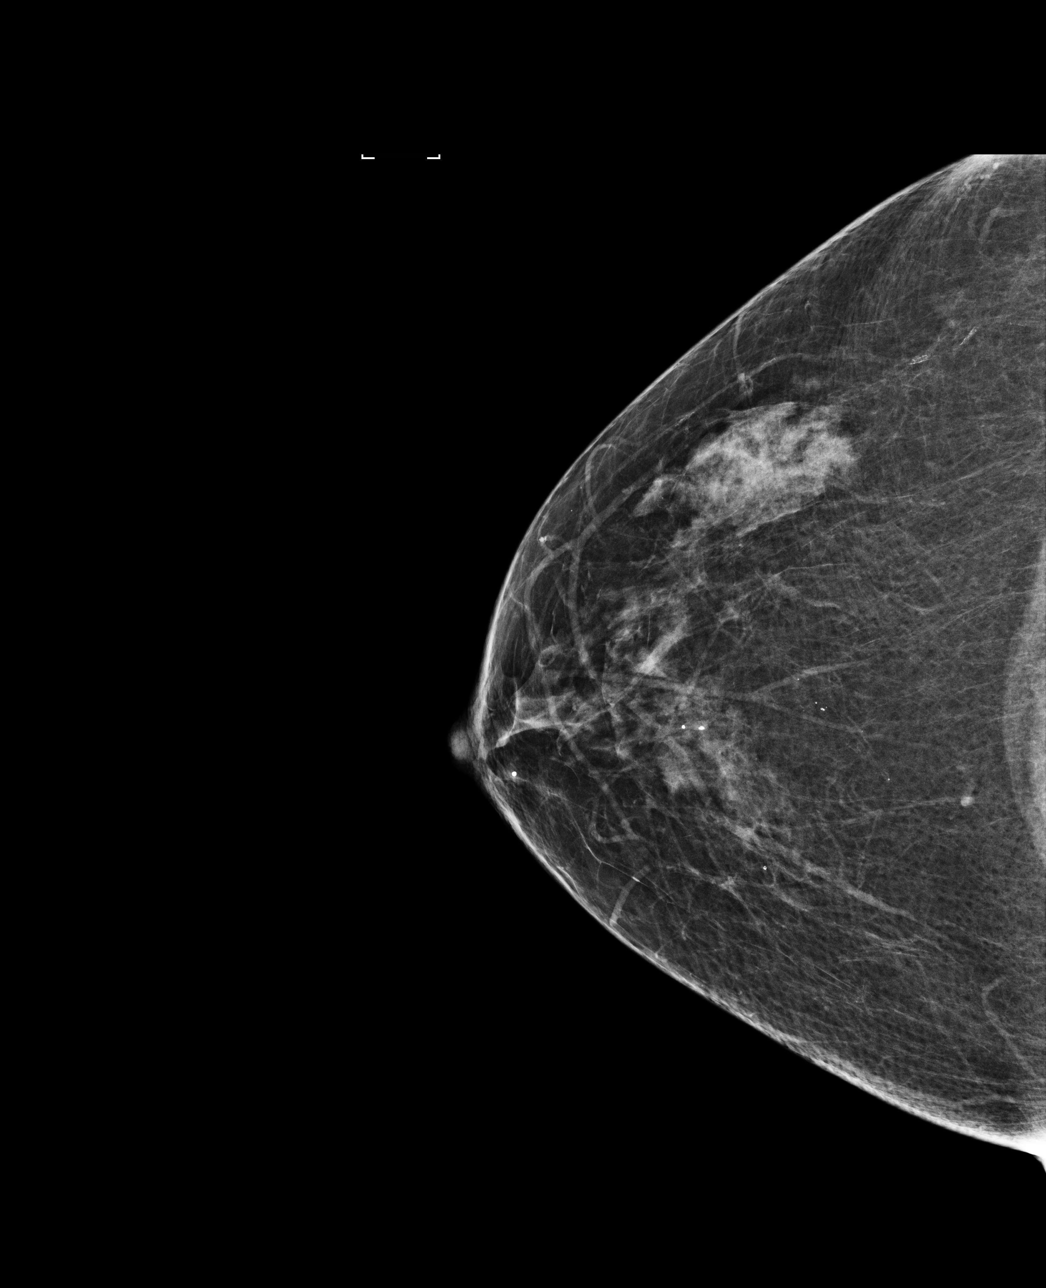

[4 of 4 positions shown; findings below may reference images not displayed]

ACR Breast Density Category b: There are scattered areas of
fibroglandular density.
FINDINGS: There are no findings suspicious for malignancy. Images were
processed with CAD.
IMPRESSION: No mammographic evidence of malignancy. A result letter of this
screening mammogram will be mailed directly to the patient.

RECOMMENDATION:
Screening mammogram in one year. (Code:[id number])

BI-RADS CATEGORY  1: Negative.

## 2021-12-12 ENCOUNTER — Encounter: Payer: Self-pay | Admitting: *Deleted

## 2021-12-16 ENCOUNTER — Other Ambulatory Visit: Payer: Self-pay | Admitting: Family Medicine

## 2021-12-16 ENCOUNTER — Other Ambulatory Visit: Payer: Self-pay | Admitting: *Deleted

## 2021-12-16 DIAGNOSIS — I10 Essential (primary) hypertension: Secondary | ICD-10-CM

## 2021-12-16 DIAGNOSIS — E119 Type 2 diabetes mellitus without complications: Secondary | ICD-10-CM

## 2021-12-16 MED ORDER — ATENOLOL 50 MG PO TABS: 50.0000 mg | ORAL_TABLET | Freq: Every day | ORAL | 0 refills | Status: DC

## 2021-12-16 MED ORDER — METFORMIN HCL 1000 MG PO TABS: 1000.0000 mg | ORAL_TABLET | Freq: Two times a day (BID) | ORAL | 0 refills | Status: DC

## 2021-12-16 MED ORDER — LISINOPRIL 40 MG PO TABS: 40.0000 mg | ORAL_TABLET | Freq: Every day | ORAL | 0 refills | Status: DC

## 2021-12-16 MED ORDER — GLIPIZIDE 10 MG PO TABS: 10.0000 mg | ORAL_TABLET | Freq: Every day | ORAL | 0 refills | Status: DC

## 2021-12-17 ENCOUNTER — Other Ambulatory Visit: Payer: Self-pay | Admitting: Family Medicine

## 2021-12-17 DIAGNOSIS — K219 Gastro-esophageal reflux disease without esophagitis: Secondary | ICD-10-CM

## 2021-12-17 DIAGNOSIS — I10 Essential (primary) hypertension: Secondary | ICD-10-CM

## 2021-12-17 DIAGNOSIS — E119 Type 2 diabetes mellitus without complications: Secondary | ICD-10-CM

## 2021-12-17 DIAGNOSIS — E7849 Other hyperlipidemia: Secondary | ICD-10-CM

## 2021-12-17 MED ORDER — LISINOPRIL 40 MG PO TABS: 40.0000 mg | ORAL_TABLET | Freq: Every day | ORAL | 0 refills | Status: DC

## 2021-12-17 MED ORDER — OMEPRAZOLE 20 MG PO CPDR: DELAYED_RELEASE_CAPSULE | ORAL | 0 refills | Status: DC

## 2021-12-17 MED ORDER — ATORVASTATIN CALCIUM 40 MG PO TABS: 40.0000 mg | ORAL_TABLET | Freq: Every day | ORAL | 0 refills | Status: DC

## 2021-12-17 MED ORDER — ATENOLOL 50 MG PO TABS: 50.0000 mg | ORAL_TABLET | Freq: Every day | ORAL | 0 refills | Status: DC

## 2021-12-17 MED ORDER — GLIPIZIDE 10 MG PO TABS: 10.0000 mg | ORAL_TABLET | Freq: Every day | ORAL | 0 refills | Status: DC

## 2021-12-17 MED ORDER — METFORMIN HCL 1000 MG PO TABS: 1000.0000 mg | ORAL_TABLET | Freq: Two times a day (BID) | ORAL | 0 refills | Status: DC

## 2022-01-24 ENCOUNTER — Encounter: Payer: Self-pay | Admitting: Family

## 2022-01-24 ENCOUNTER — Ambulatory Visit: Payer: Medicaid Other | Admitting: Family

## 2022-01-24 VITALS — BP 110/70 | HR 64 | Temp 97.2°F | Ht 60.0 in | Wt 166.0 lb

## 2022-01-24 DIAGNOSIS — Z79899 Other long term (current) drug therapy: Secondary | ICD-10-CM | POA: Diagnosis not present

## 2022-01-24 DIAGNOSIS — Z1211 Encounter for screening for malignant neoplasm of colon: Secondary | ICD-10-CM

## 2022-01-24 DIAGNOSIS — E782 Mixed hyperlipidemia: Secondary | ICD-10-CM | POA: Diagnosis not present

## 2022-01-24 DIAGNOSIS — E669 Obesity, unspecified: Secondary | ICD-10-CM | POA: Diagnosis not present

## 2022-01-24 DIAGNOSIS — L409 Psoriasis, unspecified: Secondary | ICD-10-CM

## 2022-01-24 DIAGNOSIS — E119 Type 2 diabetes mellitus without complications: Secondary | ICD-10-CM

## 2022-01-24 DIAGNOSIS — R195 Other fecal abnormalities: Secondary | ICD-10-CM | POA: Diagnosis not present

## 2022-01-24 DIAGNOSIS — H65111 Acute and subacute allergic otitis media (mucoid) (sanguinous) (serous), right ear: Secondary | ICD-10-CM | POA: Diagnosis not present

## 2022-01-24 LAB — CBC
HCT: 34.3 % — ABNORMAL LOW (ref 36.0–46.0)
Hemoglobin: 11.2 g/dL — ABNORMAL LOW (ref 12.0–15.0)
MCHC: 32.7 g/dL (ref 30.0–36.0)
MCV: 97 fl (ref 78.0–100.0)
Platelets: 274 10*3/uL (ref 150.0–400.0)
RBC: 3.54 Mil/uL — ABNORMAL LOW (ref 3.87–5.11)
RDW: 13.9 % (ref 11.5–15.5)
WBC: 8 10*3/uL (ref 4.0–10.5)

## 2022-01-24 LAB — COMPREHENSIVE METABOLIC PANEL
ALT: 10 U/L (ref 0–35)
AST: 15 U/L (ref 0–37)
Albumin: 4 g/dL (ref 3.5–5.2)
Alkaline Phosphatase: 54 U/L (ref 39–117)
BUN: 18 mg/dL (ref 6–23)
CO2: 28 mEq/L (ref 19–32)
Calcium: 9.2 mg/dL (ref 8.4–10.5)
Chloride: 106 mEq/L (ref 96–112)
Creatinine, Ser: 0.88 mg/dL (ref 0.40–1.20)
GFR: 69.87 mL/min (ref >60.00)
Glucose, Bld: 62 mg/dL — ABNORMAL LOW (ref 70–99)
Potassium: 3.6 mEq/L (ref 3.5–5.1)
Sodium: 142 mEq/L (ref 135–145)
Total Bilirubin: 0.4 mg/dL (ref 0.2–1.2)
Total Protein: 6.4 g/dL (ref 6.0–8.3)

## 2022-01-24 LAB — HEMOGLOBIN A1C: Hgb A1c MFr Bld: 7.4 % — ABNORMAL HIGH (ref 4.6–6.5)

## 2022-01-24 LAB — MICROALBUMIN / CREATININE URINE RATIO
Creatinine,U: 59 mg/dL
Microalb Creat Ratio: 3 mg/g (ref 0.0–30.0)
Microalb, Ur: 1.8 mg/dL (ref 0.0–1.9)

## 2022-01-24 MED ORDER — AMOXICILLIN-POT CLAVULANATE 875-125 MG PO TABS: 1.0000 | ORAL_TABLET | Freq: Two times a day (BID) | ORAL | 0 refills | Status: DC

## 2022-01-24 MED ORDER — CLOBETASOL PROPIONATE 0.05 % EX CREA: 1.0000 | TOPICAL_CREAM | Freq: Two times a day (BID) | CUTANEOUS | 0 refills | Status: DC

## 2022-01-24 NOTE — Assessment & Plan Note (Signed)
Work on diet and exercise as tolerated  ?

## 2022-01-24 NOTE — Progress Notes (Signed)
New Patient Office Visit  Subjective:  Patient ID: Stacy Torres, female    DOB: 02-22-1961  Age: 61 y.o. MRN: [id number]  CC:  Chief Complaint  Patient presents with   Establish Care    Fasting   Right ear ache and drainage for 2 weeks    HPI Stacy Torres is here to establish care as a new patient.  Prior provider [facility]:WEXHBZ Wilson MD Pt is without acute concerns.   C/o right ear ache with pressure with drainage of the right ear as well over the last few weeks. No nasal congestion. No sore throat. No cough or chest congestion. Chills, may have had slight chills. No medication taking over the counter.   Dexa scan: pt declines this for now.  Colonoscopy: never had, will consider cologuard, will order today.  Flu vaccine: h/o GBS does not get flu vaccine.  Shingrix vaccination: pt declines  Opthamology: negative 9/23, just with bil cataracts  Tdap up to date.   chronic concerns:  HTN: lisinopril 40 mg once daily. Not taking hctz, didn't like how it made her feel. Doing well.   GERD: prilosec 20 mg   DM2: taking metformin 1000 mg twice daily as well as glipizide 10 mg once daily.  Lab Results  Component Value Date   HGBA1C 8.2 (A) 06/02/2021    Psoriasis: worse in right ear with flaking. Out of clobetasol requesting refill.   Past Medical History:  Diagnosis Date   Anxiety    Phreesia 10/23/2019   Arthritis    Phreesia 10/23/2019   Diabetes mellitus without complication ([place])    Hyperlipidemia    Phreesia 10/23/2019   Hypertension     Past Surgical History:  Procedure Laterality Date   ABDOMINAL HYSTERECTOMY     complete    Family History  Problem Relation Age of Onset   Cancer - Lung Mother        smokers   Mesothelioma Father        worked in [place]   Alcohol abuse Father    Breast cancer Daughter 62       And again 5 years later    Social History   Socioeconomic History   Marital status: Legally Separated    Spouse name: Not  on file   Number of children: 1   Years of education: Not on file   Highest education level: Not on file  Occupational History   Occupation: retired  Tobacco Use   Smoking status: Never   Smokeless tobacco: Never  Substance and Sexual Activity   Alcohol use: No   Drug use: No   Sexual activity: Not Currently  Other Topics Concern   Not on file  Social History Narrative   Not on file   Social Determinants of Health   Financial Resource Strain: Not on file  Food Insecurity: Not on file  Transportation Needs: Not on file  Physical Activity: Not on file  Stress: Not on file  Social Connections: Not on file  Intimate Partner Violence: Not on file    Outpatient Medications Prior to Visit  Medication Sig Dispense Refill   Accu-Chek FastClix Lancets MISC Check fasting blood sugar daily. Dx: E11.9 102 each 2   Aspirin 81 MG CAPS Take by mouth.     atenolol (TENORMIN) 50 MG tablet Take 1 tablet (50 mg total) by mouth daily. 30 tablet 0   atorvastatin (LIPITOR) 40 MG tablet Take 1 tablet (40 mg total) by mouth daily.  30 tablet 0   blood glucose meter kit and supplies KIT Dispense based on patient and insurance preference. Use up to BID  times daily as directed to check blood sugar. E11.89 1 each 0   clotrimazole-betamethasone (LOTRISONE) cream APPLY TO AFFECTED AREA TWICE A DAY 30 g 0   glipiZIDE (GLUCOTROL) 10 MG tablet Take 1 tablet (10 mg total) by mouth daily before breakfast. 30 tablet 0   glucose blood (ACCU-CHEK GUIDE) test strip Check fasting blood sugar daily. Dx: E11.9 100 each 3   Lancets Misc. (ACCU-CHEK FASTCLIX LANCET) KIT Check fasting blood sugar daily. Dx: E11.9 1 kit 0   lisinopril (ZESTRIL) 40 MG tablet Take 1 tablet (40 mg total) by mouth daily. 30 tablet 0   metFORMIN (GLUCOPHAGE) 1000 MG tablet Take 1 tablet (1,000 mg total) by mouth 2 (two) times daily with a meal. 60 tablet 0   omeprazole (PRILOSEC) 20 MG capsule TAKE 1 CAPSULE BY MOUTH EVERY DAY AS NEEDED 30  capsule 0   hydrochlorothiazide (HYDRODIURIL) 25 MG tablet Take 1 tablet (25 mg total) by mouth daily. 90 tablet 1   No facility-administered medications prior to visit.    Allergies  Allergen Reactions   Hydrochlorothiazide Other (See Comments)    Dizziness and nausea   Influenza A (H1n1) Monovalent Vaccine Other (See Comments)    H/o gbs as a child    ROS Review of Systems  Review of Systems  Respiratory:  Negative for shortness of breath.   Cardiovascular:  Negative for chest pain and palpitations.  Gastrointestinal:  Negative for constipation and diarrhea.  Genitourinary:  Negative for dysuria, frequency and urgency.  Musculoskeletal:  Negative for myalgias.  Psychiatric/Behavioral:  Negative for depression and suicidal ideas.   All other systems reviewed and are negative.    Objective:    Physical Exam Vitals reviewed.  Constitutional:      General: She is not in acute distress.    Appearance: Normal appearance. She is obese. She is not ill-appearing, toxic-appearing or diaphoretic.  HENT:     Right Ear: A middle ear effusion is present. Tympanic membrane is erythematous. Tympanic membrane is not bulging.     Left Ear: Tympanic membrane normal.     Mouth/Throat:     Mouth: Mucous membranes are moist.     Pharynx: No pharyngeal swelling.     Tonsils: No tonsillar exudate.  Eyes:     Extraocular Movements: Extraocular movements intact.     Conjunctiva/sclera: Conjunctivae normal.     Pupils: Pupils are equal, round, and reactive to light.  Neck:     Thyroid: No thyroid mass.  Cardiovascular:     Rate and Rhythm: Normal rate and regular rhythm.  Pulmonary:     Effort: Pulmonary effort is normal.     Breath sounds: Normal breath sounds.  Abdominal:     General: Abdomen is flat. Bowel sounds are normal.     Palpations: Abdomen is soft.  Musculoskeletal:        General: Normal range of motion.  Lymphadenopathy:     Cervical:     Right cervical: No superficial  cervical adenopathy.    Left cervical: No superficial cervical adenopathy.  Skin:    General: Skin is warm.     Capillary Refill: Capillary refill takes less than 2 seconds.     Comments: Dry flaking skin at entrance to right ear canal   Neurological:     General: No focal deficit present.     Mental  Status: She is alert and oriented to person, place, and time.  Psychiatric:        Mood and Affect: Mood normal.        Behavior: Behavior normal.        Thought Content: Thought content normal.        Judgment: Judgment normal.       BP 110/70   Pulse 64   Temp (!) 97.2 F (36.2 C) (Temporal)   Ht 5' (1.524 m)   Wt 166 lb (75.3 kg)   SpO2 98%   BMI 32.42 kg/m  Wt Readings from Last 3 Encounters:  01/24/22 166 lb (75.3 kg)  06/02/21 162 lb 3.7 oz (73.6 kg)  01/13/21 153 lb 3.2 oz (69.5 kg)     Health Maintenance Due  Topic Date Due   COLONOSCOPY (Pts 45-20yr Insurance coverage will need to be confirmed)  Never done   Diabetic kidney evaluation - GFR measurement  11/02/2020   Diabetic kidney evaluation - Urine ACR  11/02/2020   FOOT EXAM  02/11/2021   HEMOGLOBIN A1C  12/03/2021    There are no preventive care reminders to display for this patient.  Lab Results  Component Value Date   TSH 2.620 10/10/2018   Lab Results  Component Value Date   WBC 8.5 11/03/2019   HGB 11.6 11/03/2019   HCT 36.4 11/03/2019   MCV 90 11/03/2019   PLT 316 11/03/2019   Lab Results  Component Value Date   NA 139 11/03/2019   K 4.9 11/03/2019   CO2 22 11/03/2019   GLUCOSE 116 (H) 11/03/2019   BUN 17 11/03/2019   CREATININE 0.92 11/03/2019   BILITOT 0.3 11/03/2019   ALKPHOS 68 11/03/2019   AST 21 11/03/2019   ALT 17 11/03/2019   PROT 7.1 11/03/2019   ALBUMIN 4.5 11/03/2019   CALCIUM 9.8 11/03/2019   Lab Results  Component Value Date   CHOL 242 (H) 11/03/2019   Lab Results  Component Value Date   HDL 53 11/03/2019   Lab Results  Component Value Date   LDLCALC 148  (H) 11/03/2019   Lab Results  Component Value Date   TRIG 225 (H) 11/03/2019   Lab Results  Component Value Date   CHOLHDL 4.6 (H) 11/03/2019   Lab Results  Component Value Date   HGBA1C 8.2 (A) 06/02/2021      Assessment & Plan:   Problem List Items Addressed This Visit       Endocrine   DM (diabetes mellitus) ([place]    Continue metformin 500 and glipizide 10 mg Ordered hga1c today pending results. Work on diabetic diet and exercise as tolerated. Yearly foot exam, and annual eye exam.        Relevant Medications   Aspirin 81 MG CAPS   Other Relevant Orders   Microalbumin/Creatinine Ratio, Urine   Hemoglobin A1c   CBC     Nervous and Auditory   Acute mucoid otitis media of right ear    rx augmentin 875/125 mg po bid x 10 days Take antibiotic as prescribed. Increase oral fluids. Pt to f/u if sx worsen and or fail to improve in 2-3 days.       Relevant Medications   amoxicillin-clavulanate (AUGMENTIN) 875-125 MG tablet     Musculoskeletal and Integument   Psoriasis   Relevant Medications   clobetasol cream (TEMOVATE) 0.05 %     Other   Hyperlipidemia    Ordered lipid panel, pending results. Work on low cholesterol  diet and exercise as tolerated Continue statin       Relevant Medications   Aspirin 81 MG CAPS   Obesity (BMI 30-39.9)    Work on diet and exercise as tolerated       Relevant Orders   CBC   Other Visit Diagnoses     Screening for colon cancer    -  Primary   Relevant Orders   Cologuard   On statin therapy       Relevant Orders   Comprehensive metabolic panel       Meds ordered this encounter  Medications   amoxicillin-clavulanate (AUGMENTIN) 875-125 MG tablet    Sig: Take 1 tablet by mouth 2 (two) times daily.    Dispense:  20 tablet    Refill:  0    Order Specific Question:   Supervising Provider    Answer:   BEDSOLE, AMY E [[id number]]   clobetasol cream (TEMOVATE) 0.05 %    Sig: Apply 1 Application topically 2 (two) times  daily.    Dispense:  30 g    Refill:  0    Order Specific Question:   Supervising Provider    Answer:   Diona Browner, AMY E [[id number]]    Follow-up: Return in about 6 months (around 07/26/2022) for f/u diabetes.    Eugenia Pancoast, FNP

## 2022-01-24 NOTE — Assessment & Plan Note (Signed)
rx augmentin 875/125 mg po bid x 10 days Take antibiotic as prescribed. Increase oral fluids. Pt to f/u if sx worsen and or fail to improve in 2-3 days.  

## 2022-01-24 NOTE — Patient Instructions (Signed)
Welcome to our clinic, I am happy to have you as my new patient. I am excited to continue on this healthcare journey with you.  Stop by the lab prior to leaving today. I will notify you of your results once received.   Please keep in mind Any my chart messages you send have up to a three business day turnaround for a response.  Phone calls may take up to a one full business day turnaround for a  response.   If you need a medication refill I recommend you request it through the pharmacy as this is easiest for [place] rather than sending a message and or phone call.   Due to recent changes in healthcare laws, you may see results of your imaging and/or laboratory studies on MyChart before I have had a chance to review them.  I understand that in some cases there may be results that are confusing or concerning to you. Please understand that not all results are received at the same time and often I may need to interpret multiple results in order to provide you with the best plan of care or course of treatment. Therefore, I ask that you please give me 2 business days to thoroughly review all your results before contacting my office for clarification. Should we see a critical lab result, you will be contacted sooner.   It was a pleasure seeing you today! Please do not hesitate to reach out with any questions and or concerns.  Regards,   Anju Sereno FNP-C  ------------------------------------ Antibiotic sent to preferred pharmacy.   Please increase oral fluids, steamy hot shower/humidifier prn.  Please follow up if no improvement in 2-3 days.   It was a pleasure seeing you today! Please do not hesitate to reach out with any questions and or concerns.  Regards,   Eugenia Pancoast

## 2022-01-24 NOTE — Assessment & Plan Note (Signed)
Continue metformin 500 and glipizide 10 mg Ordered hga1c today pending results. Work on diabetic diet and exercise as tolerated. Yearly foot exam, and annual eye exam.

## 2022-01-24 NOTE — Assessment & Plan Note (Signed)
Ordered lipid panel, pending results. Work on low cholesterol diet and exercise as tolerated Continue statin

## 2022-01-25 ENCOUNTER — Telehealth: Payer: Self-pay | Admitting: Family

## 2022-01-25 ENCOUNTER — Other Ambulatory Visit: Payer: Self-pay | Admitting: Family

## 2022-01-25 DIAGNOSIS — E119 Type 2 diabetes mellitus without complications: Secondary | ICD-10-CM

## 2022-01-25 MED ORDER — LINAGLIPTIN 5 MG PO TABS: 5.0000 mg | ORAL_TABLET | Freq: Every day | ORAL | 1 refills | Status: DC

## 2022-01-25 NOTE — Progress Notes (Signed)
Some improvement with diabetic profile, a1c decreased from 8.2 to 7.4. goal is still less than 7.  Continue metformin 1000 mg twice daily. Continue glipizide 10 mg once daily.  Let's start a new medication called tradjenta, let me know if too expensive. Have pt make f/u appt in three months to repeat labs and have in office visit to see how tolerating medications.   Slight anemia, any blood in stool? Will continue to monitor.

## 2022-01-25 NOTE — Telephone Encounter (Signed)
Patient daughter called and stated medications were sent to the wrong pharmacy and it should go to [facility] on [address] in [place].

## 2022-01-27 NOTE — Telephone Encounter (Signed)
Left message to return call to our office.  

## 2022-01-30 NOTE — Telephone Encounter (Signed)
Called the daughter but no answer. She is not on mom DPR so I can't talk to her about her mom.

## 2022-01-31 ENCOUNTER — Other Ambulatory Visit: Payer: Self-pay

## 2022-01-31 DIAGNOSIS — Z1211 Encounter for screening for malignant neoplasm of colon: Secondary | ICD-10-CM | POA: Diagnosis not present

## 2022-01-31 DIAGNOSIS — E119 Type 2 diabetes mellitus without complications: Secondary | ICD-10-CM

## 2022-01-31 MED ORDER — LINAGLIPTIN 5 MG PO TABS: 5.0000 mg | ORAL_TABLET | Freq: Every day | ORAL | 1 refills | Status: DC

## 2022-01-31 NOTE — Telephone Encounter (Signed)
Called and pt stated that she got her medication already. I also advised that her daughter was not on her DPR so we could not release any information to her. Pt stated she would put her on her DPR next time she was in.

## 2022-02-04 ENCOUNTER — Other Ambulatory Visit: Payer: Self-pay | Admitting: Family Medicine

## 2022-02-04 DIAGNOSIS — I10 Essential (primary) hypertension: Secondary | ICD-10-CM

## 2022-02-06 NOTE — Telephone Encounter (Signed)
Requested medication (s) are due for refill today:yes  Requested medication (s) are on the active medication list:yes  Last refill:  12/17/21  Future visit scheduled:no  Notes to clinic:  Unable to refill per protocol, established care with another provider.  Routing for review.     Requested Prescriptions  Pending Prescriptions Disp Refills   lisinopril (ZESTRIL) 40 MG tablet [Pharmacy Med Name: LISINOPRIL 40 MG TABLET] 90 tablet 1    Sig: TAKE 1 TABLET BY MOUTH EVERY DAY     Cardiovascular:  ACE Inhibitors Failed - 02/04/2022  9:31 AM      Failed - Valid encounter within last 6 months    Recent Outpatient Visits           8 months ago Type 2 diabetes mellitus without complication, without long-term current use of insulin ([place])   [facility] at [facility], MD   1 year ago Essential hypertension   Primary Care at [facility], Clyde Canterbury, MD   1 year ago Type 2 diabetes mellitus without complication, without long-term current use of insulin [facility])   Primary Care at [facility], Amy J, NP   1 year ago Localized swelling of chest wall   Primary Care at [facility], [place], NP   1 year ago Type 2 diabetes mellitus without complication, without long-term current use of insulin [facility])   Primary Care at [facility], Bayard Beaver, MD       Future Appointments             In 2 months Eugenia Pancoast, [place] at [place], [place] in normal range and within 180 days    Creat  Date Value Ref Range Status  04/14/2016 0.82 0.50 - 1.05 mg/dL Final    Comment:      For patients > or = 63 years of age: The upper reference limit for Creatinine is approximately 13% higher for people identified as African-American.      Creatinine, Ser  Date Value Ref Range Status  01/24/2022 0.88 0.40 - 1.20 mg/dL Final   Creatinine,U  Date Value Ref Range Status  01/24/2022 59.0 mg/dL Final          Passed - K in normal range and within 180 days    Potassium  Date Value Ref Range Status  01/24/2022 3.6 3.5 - 5.1 mEq/L Final         Passed - Patient is not pregnant      Passed - Last BP in normal range    BP Readings from Last 1 Encounters:  01/24/22 110/70          atenolol (TENORMIN) 50 MG tablet [Pharmacy Med Name: ATENOLOL 50 MG TABLET] 90 tablet 1    Sig: TAKE 1 TABLET BY MOUTH EVERY DAY     Cardiovascular: Beta Blockers 2 Failed - 02/04/2022  9:31 AM      Failed - Valid encounter within last 6 months    Recent Outpatient Visits           8 months ago Type 2 diabetes mellitus without complication, without long-term current use of insulin ([place])   Primary Care at [facility], MD   1 year ago Essential hypertension   Primary Care at [facility], Clyde Canterbury, MD   1 year ago Type 2 diabetes mellitus without complication, without long-term current use  of insulin [facility])   Primary Care at [facility], Amy J, NP   1 year ago Localized swelling of chest wall   Primary Care at [facility], Amy J, NP   1 year ago Type 2 diabetes mellitus without complication, without long-term current use of insulin [facility])   Primary Care at [facility], Bayard Beaver, MD       Future Appointments             In 2 months Eugenia Pancoast, [place] at [place], [place] in normal range and within 360 days    Creat  Date Value Ref Range Status  04/14/2016 0.82 0.50 - 1.05 mg/dL Final    Comment:      For patients > or = 63 years of age: The upper reference limit for Creatinine is approximately 13% higher for people identified as African-American.      Creatinine, Ser  Date Value Ref Range Status  01/24/2022 0.88 0.40 - 1.20 mg/dL Final   Creatinine,U  Date Value Ref Range Status  01/24/2022 59.0 mg/dL Final         Passed - Last BP in normal range    BP Readings from Last 1  Encounters:  01/24/22 110/70         Passed - Last Heart Rate in normal range    Pulse Readings from Last 1 Encounters:  01/24/22 64          glipiZIDE (GLUCOTROL) 10 MG tablet [Pharmacy Med Name: GLIPIZIDE 10 MG TABLET] 90 tablet 1    Sig: TAKE 1 TABLET BY MOUTH DAILY BEFORE BREAKFAST.     Endocrinology:  Diabetes - Sulfonylureas Failed - 02/04/2022  9:31 AM      Failed - Valid encounter within last 6 months    Recent Outpatient Visits           8 months ago Type 2 diabetes mellitus without complication, without long-term current use of insulin [facility])   Primary Care at [facility], MD   1 year ago Essential hypertension   Primary Care at [facility], MD   1 year ago Type 2 diabetes mellitus without complication, without long-term current use of insulin [facility])   Primary Care at [facility], Amy J, NP   1 year ago Localized swelling of chest wall   Primary Care at [facility], Amy J, NP   1 year ago Type 2 diabetes mellitus without complication, without long-term current use of insulin [facility])   Primary Care at [facility], Bayard Beaver, MD       Future Appointments             In 2 months Eugenia Pancoast, [place] at [place], [place] is between 0 and 7.9 and within 180 days    HbA1c, POC (controlled diabetic range)  Date Value Ref Range Status  06/16/2020 7.8 (A) 0.0 - 7.0 % Final   Hgb A1c MFr Bld  Date Value Ref Range Status  01/24/2022 7.4 (H) 4.6 - 6.5 % Final    Comment:    Glycemic Control Guidelines for People with Diabetes:Non Diabetic:  <6%Goal of Therapy: <7%Additional Action Suggested:  >8%          Passed - Cr in normal range and  within 360 days    Creat  Date Value Ref Range Status  04/14/2016 0.82 0.50 - 1.05 mg/dL Final    Comment:      For patients > or = 63 years of age: The upper reference limit for Creatinine is approximately 13%  higher for people identified as African-American.      Creatinine, Ser  Date Value Ref Range Status  01/24/2022 0.88 0.40 - 1.20 mg/dL Final   Creatinine,U  Date Value Ref Range Status  01/24/2022 59.0 mg/dL Final

## 2022-02-07 ENCOUNTER — Other Ambulatory Visit: Payer: Self-pay | Admitting: Family Medicine

## 2022-02-07 NOTE — Telephone Encounter (Signed)
Requested Prescriptions  Pending Prescriptions Disp Refills   glipiZIDE (GLUCOTROL) 10 MG tablet [Pharmacy Med Name: GLIPIZIDE 10 MG TABLET] 90 tablet 1    Sig: TAKE 1 TABLET BY MOUTH DAILY BEFORE BREAKFAST.     Endocrinology:  Diabetes - Sulfonylureas Failed - 02/07/2022  8:20 AM      Failed - Valid encounter within last 6 months    Recent Outpatient Visits           8 months ago Type 2 diabetes mellitus without complication, without long-term current use of insulin [facility])   [facility] at [facility], MD   1 year ago Essential hypertension   Primary Care at [facility], MD   1 year ago Type 2 diabetes mellitus without complication, without long-term current use of insulin [facility])   Primary Care at [facility], Amy J, NP   1 year ago Localized swelling of chest wall   Primary Care at [facility] Amy J, NP   1 year ago Type 2 diabetes mellitus without complication, without long-term current use of insulin [facility])   Primary Care at [facility], Bayard Beaver, MD       Future Appointments             In 2 months Eugenia Pancoast, [place] at [place], [place] is between 0 and 7.9 and within 180 days    HbA1c, POC (controlled diabetic range)  Date Value Ref Range Status  06/16/2020 7.8 (A) 0.0 - 7.0 % Final   Hgb A1c MFr Bld  Date Value Ref Range Status  01/24/2022 7.4 (H) 4.6 - 6.5 % Final    Comment:    Glycemic Control Guidelines for People with Diabetes:Non Diabetic:  <6%Goal of Therapy: <7%Additional Action Suggested:  >8%          Passed - Cr in normal range and within 360 days    Creat  Date Value Ref Range Status  04/14/2016 0.82 0.50 - 1.05 mg/dL Final    Comment:      For patients > or = 63 years of age: The upper reference limit for Creatinine is approximately 13% higher for people identified as African-American.      Creatinine, Ser  Date Value  Ref Range Status  01/24/2022 0.88 0.40 - 1.20 mg/dL Final   Creatinine,U  Date Value Ref Range Status  01/24/2022 59.0 mg/dL Final

## 2022-02-08 LAB — COLOGUARD: COLOGUARD: POSITIVE — AB

## 2022-02-09 DIAGNOSIS — R195 Other fecal abnormalities: Secondary | ICD-10-CM | POA: Insufficient documentation

## 2022-02-09 NOTE — Addendum Note (Signed)
Addended by: Eugenia Pancoast on: 02/09/2022 04:06 PM   Modules accepted: Orders

## 2022-02-15 ENCOUNTER — Encounter: Payer: Self-pay | Admitting: *Deleted

## 2022-02-15 ENCOUNTER — Encounter: Payer: Self-pay | Admitting: Family

## 2022-02-15 DIAGNOSIS — I10 Essential (primary) hypertension: Secondary | ICD-10-CM

## 2022-02-15 MED ORDER — GLIPIZIDE 10 MG PO TABS: 10.0000 mg | ORAL_TABLET | Freq: Every day | ORAL | 1 refills | Status: DC

## 2022-02-15 MED ORDER — LISINOPRIL 40 MG PO TABS: 40.0000 mg | ORAL_TABLET | Freq: Every day | ORAL | 1 refills | Status: DC

## 2022-02-15 NOTE — Telephone Encounter (Signed)
Please review  Thank you

## 2022-02-16 ENCOUNTER — Other Ambulatory Visit: Payer: Self-pay

## 2022-02-16 ENCOUNTER — Telehealth: Payer: Medicaid Other

## 2022-02-16 DIAGNOSIS — Z1211 Encounter for screening for malignant neoplasm of colon: Secondary | ICD-10-CM

## 2022-02-16 DIAGNOSIS — R195 Other fecal abnormalities: Secondary | ICD-10-CM

## 2022-02-16 MED ORDER — NA SULFATE-K SULFATE-MG SULF 17.5-3.13-1.6 GM/177ML PO SOLN: 1.0000 | Freq: Once | ORAL | 0 refills | Status: AC

## 2022-02-16 NOTE — Telephone Encounter (Signed)
Gastroenterology Pre-Procedure Review  Request Date: 03/09/22 Requesting Physician: Dr. Vicente Males  PATIENT REVIEW QUESTIONS: The patient responded to the following health history questions as indicated:    1. Are you having any GI issues? no this is patient's 1st colonoscopy. She has a positive colorectal using colorguard 2. Do you have a personal history of Polyps? no 3. Do you have a family history of Colon Cancer or Polyps? no 4. Diabetes Mellitus? Yes dm patient has been asked to stop Metformin on 12/05.  Stop Glipizide and Trajenta on 03/08/22 5. Joint replacements in the past 12 months?no 6. Major health problems in the past 3 months?no 7. Any artificial heart valves, MVP, or defibrillator?no    MEDICATIONS & ALLERGIES:    Patient reports the following regarding taking any anticoagulation/antiplatelet therapy:   Plavix, Coumadin, Eliquis, Xarelto, Lovenox, Pradaxa, Brilinta, or Effient? no Aspirin? yes (25m daily)  Patient confirms/reports the following medications:  Current Outpatient Medications  Medication Sig Dispense Refill   Accu-Chek FastClix Lancets MISC Check fasting blood sugar daily. Dx: E11.9 102 each 2   amoxicillin-clavulanate (AUGMENTIN) 875-125 MG tablet Take 1 tablet by mouth 2 (two) times daily. 20 tablet 0   Aspirin 81 MG CAPS Take by mouth.     atenolol (TENORMIN) 50 MG tablet TAKE 1 TABLET BY MOUTH EVERY DAY 30 tablet 0   atorvastatin (LIPITOR) 40 MG tablet Take 1 tablet (40 mg total) by mouth daily. 30 tablet 0   blood glucose meter kit and supplies KIT Dispense based on patient and insurance preference. Use up to BID  times daily as directed to check blood sugar. E11.89 1 each 0   clobetasol cream (TEMOVATE) 07.16% Apply 1 Application topically 2 (two) times daily. 30 g 0   clotrimazole-betamethasone (LOTRISONE) cream APPLY TO AFFECTED AREA TWICE A DAY 30 g 0   glipiZIDE (GLUCOTROL) 10 MG tablet Take 1 tablet (10 mg total) by mouth daily before breakfast. 90  tablet 1   glucose blood (ACCU-CHEK GUIDE) test strip Check fasting blood sugar daily. Dx: E11.9 100 each 3   Lancets Misc. (ACCU-CHEK FASTCLIX LANCET) KIT Check fasting blood sugar daily. Dx: E11.9 1 kit 0   linagliptin (TRADJENTA) 5 MG TABS tablet Take 1 tablet (5 mg total) by mouth daily. 90 tablet 1   lisinopril (ZESTRIL) 40 MG tablet Take 1 tablet (40 mg total) by mouth daily. 90 tablet 1   metFORMIN (GLUCOPHAGE) 1000 MG tablet Take 1 tablet (1,000 mg total) by mouth 2 (two) times daily with a meal. 60 tablet 0   omeprazole (PRILOSEC) 20 MG capsule TAKE 1 CAPSULE BY MOUTH EVERY DAY AS NEEDED 30 capsule 0   No current facility-administered medications for this visit.    Patient confirms/reports the following allergies:  Allergies  Allergen Reactions   Hydrochlorothiazide Other (See Comments)    Dizziness and nausea   Influenza A (H1n1) Monovalent Vaccine Other (See Comments)    H/o gbs as a child    No orders of the defined types were placed in this encounter.   AUTHORIZATION INFORMATION Primary Insurance: 1D#: Group #:  Secondary Insurance: 1D#: Group #:  SCHEDULE INFORMATION: Date: 03/09/22 Time: Location: [facility]

## 2022-03-01 ENCOUNTER — Other Ambulatory Visit: Payer: Self-pay | Admitting: Family Medicine

## 2022-03-01 DIAGNOSIS — K219 Gastro-esophageal reflux disease without esophagitis: Secondary | ICD-10-CM

## 2022-03-04 ENCOUNTER — Encounter: Payer: Self-pay | Admitting: Family

## 2022-03-04 DIAGNOSIS — K219 Gastro-esophageal reflux disease without esophagitis: Secondary | ICD-10-CM

## 2022-03-05 NOTE — Telephone Encounter (Signed)
Was not filled by you in the past. Does have follow up next month scheduled ok to fill as pended?

## 2022-03-07 MED ORDER — OMEPRAZOLE 20 MG PO CPDR: DELAYED_RELEASE_CAPSULE | ORAL | 0 refills | Status: DC

## 2022-03-09 ENCOUNTER — Encounter
Admission: RE | Disposition: A | Discharge status: Home / Self Care | Payer: Self-pay | Source: Ambulatory Visit | Attending: Gastroenterology

## 2022-03-09 ENCOUNTER — Ambulatory Visit: Payer: Medicaid Other | Admitting: Anesthesiology

## 2022-03-09 ENCOUNTER — Encounter: Payer: Self-pay | Admitting: Gastroenterology

## 2022-03-09 ENCOUNTER — Ambulatory Visit
Admission: RE | Admit: 2022-03-09 | Discharge: 2022-03-09 | Disposition: A | Discharge status: Home / Self Care | Payer: Medicaid Other | Source: Ambulatory Visit | Attending: Gastroenterology | Admitting: Gastroenterology

## 2022-03-09 DIAGNOSIS — E119 Type 2 diabetes mellitus without complications: Secondary | ICD-10-CM | POA: Insufficient documentation

## 2022-03-09 DIAGNOSIS — E785 Hyperlipidemia, unspecified: Secondary | ICD-10-CM | POA: Diagnosis not present

## 2022-03-09 DIAGNOSIS — K219 Gastro-esophageal reflux disease without esophagitis: Secondary | ICD-10-CM | POA: Insufficient documentation

## 2022-03-09 DIAGNOSIS — I10 Essential (primary) hypertension: Secondary | ICD-10-CM | POA: Diagnosis not present

## 2022-03-09 DIAGNOSIS — R195 Other fecal abnormalities: Secondary | ICD-10-CM | POA: Insufficient documentation

## 2022-03-09 DIAGNOSIS — D126 Benign neoplasm of colon, unspecified: Secondary | ICD-10-CM | POA: Diagnosis not present

## 2022-03-09 DIAGNOSIS — Z1211 Encounter for screening for malignant neoplasm of colon: Secondary | ICD-10-CM | POA: Insufficient documentation

## 2022-03-09 DIAGNOSIS — K635 Polyp of colon: Secondary | ICD-10-CM | POA: Diagnosis not present

## 2022-03-09 DIAGNOSIS — D123 Benign neoplasm of transverse colon: Secondary | ICD-10-CM | POA: Insufficient documentation

## 2022-03-09 DIAGNOSIS — D125 Benign neoplasm of sigmoid colon: Secondary | ICD-10-CM | POA: Insufficient documentation

## 2022-03-09 HISTORY — PX: COLONOSCOPY WITH PROPOFOL: SHX5780

## 2022-03-09 SURGERY — COLONOSCOPY WITH PROPOFOL
Anesthesia: General

## 2022-03-09 MED ORDER — PROPOFOL 10 MG/ML IV BOLUS
INTRAVENOUS | Status: DC | PRN
  Administered 2022-03-09: 80 mg via INTRAVENOUS

## 2022-03-09 MED ORDER — METOPROLOL TARTRATE 5 MG/5ML IV SOLN
INTRAVENOUS | Status: DC | PRN
  Administered 2022-03-09 (×2): 2 mg via INTRAVENOUS

## 2022-03-09 MED ORDER — PROPOFOL 500 MG/50ML IV EMUL
INTRAVENOUS | Status: DC | PRN
  Administered 2022-03-09: 194.542 ug/kg/min via INTRAVENOUS

## 2022-03-09 MED ORDER — SODIUM CHLORIDE 0.9 % IV SOLN: INTRAVENOUS | Status: DC

## 2022-03-09 MED ORDER — LIDOCAINE HCL (CARDIAC) PF 100 MG/5ML IV SOSY
PREFILLED_SYRINGE | INTRAVENOUS | Status: DC | PRN
  Administered 2022-03-09: 100 mg via INTRAVENOUS

## 2022-03-09 NOTE — Anesthesia Postprocedure Evaluation (Signed)
Anesthesia Post Note  Patient: Stacy Torres  Procedure(s) Performed: COLONOSCOPY WITH PROPOFOL  Patient location during evaluation: Endoscopy Anesthesia Type: General Level of consciousness: awake and alert Pain management: pain level controlled Vital Signs Assessment: post-procedure vital signs reviewed and stable Respiratory status: spontaneous breathing, nonlabored ventilation, respiratory function stable and patient connected to nasal cannula oxygen Cardiovascular status: blood pressure returned to baseline and stable Postop Assessment: no apparent nausea or vomiting Anesthetic complications: no   No notable events documented.   Last Vitals:  Vitals:   03/09/22 0907 03/09/22 0917  BP: 108/64 (!) 153/80  Pulse: 90   Resp: 15 19  Temp:    SpO2: 95% 100%    Last Pain:  Vitals:   03/09/22 0917  TempSrc:   PainSc: 0-No pain                 Precious Haws Jaleea Alesi

## 2022-03-09 NOTE — H&P (Addendum)
Jonathon Bellows, MD [address], [place], [place], [place], [address] [address], [place], [place], [place], [address] Phone: [phone]  Fax: [phone]  Primary Care Physician:  Eugenia Pancoast, FNP   Pre-Procedure History & Physical: HPI:  Stacy Torres is a 61 y.o. female is here for an colonoscopy.   Past Medical History:  Diagnosis Date   Anxiety    Phreesia 10/23/2019   Arthritis    Phreesia 10/23/2019   Diabetes mellitus without complication ([place])    Hyperlipidemia    Phreesia 10/23/2019   Hypertension     Past Surgical History:  Procedure Laterality Date   ABDOMINAL HYSTERECTOMY     complete   ABDOMINAL HYSTERECTOMY      Prior to Admission medications   Medication Sig Start Date End Date Taking? Authorizing Provider  Accu-Chek FastClix Lancets MISC Check fasting blood sugar daily. Dx: E11.9 02/12/20  Yes Nicolette Bang, MD  Aspirin 81 MG CAPS Take by mouth.   Yes [provider]  atenolol (TENORMIN) 50 MG tablet TAKE 1 TABLET BY MOUTH EVERY DAY 02/07/22  Yes Dorna Mai, MD  atorvastatin (LIPITOR) 40 MG tablet Take 1 tablet (40 mg total) by mouth daily. 12/17/21  Yes Dorna Mai, MD  blood glucose meter kit and supplies KIT Dispense based on patient and insurance preference. Use up to BID  times daily as directed to check blood sugar. E11.89 10/10/18  Yes Scot Jun, FNP  clobetasol cream (TEMOVATE) 2.35 % Apply 1 Application topically 2 (two) times daily. 01/24/22  Yes Eugenia Pancoast, FNP  clotrimazole-betamethasone (LOTRISONE) cream APPLY TO AFFECTED AREA TWICE A DAY 09/01/20  Yes Nicolette Bang, MD  glipiZIDE (GLUCOTROL) 10 MG tablet Take 1 tablet (10 mg total) by mouth daily before breakfast. 02/15/22  Yes Dugal, Tabitha, FNP  glucose blood (ACCU-CHEK GUIDE) test strip Check fasting blood sugar daily. Dx: E11.9 02/12/20  Yes Nicolette Bang, MD  Lancets Misc. (ACCU-CHEK FASTCLIX LANCET) KIT Check  fasting blood sugar daily. Dx: E11.9 02/12/20  Yes Nicolette Bang, MD  linagliptin (TRADJENTA) 5 MG TABS tablet Take 1 tablet (5 mg total) by mouth daily. 01/31/22  Yes Dugal, Lawerance Bach, FNP  lisinopril (ZESTRIL) 40 MG tablet Take 1 tablet (40 mg total) by mouth daily. 02/15/22  Yes Eugenia Pancoast, FNP  metFORMIN (GLUCOPHAGE) 1000 MG tablet Take 1 tablet (1,000 mg total) by mouth 2 (two) times daily with a meal. 12/17/21  Yes Dorna Mai, MD  omeprazole (PRILOSEC) 20 MG capsule TAKE 1 CAPSULE BY MOUTH EVERY DAY AS NEEDED 03/07/22  Yes Dugal, Tabitha, FNP  amoxicillin-clavulanate (AUGMENTIN) 875-125 MG tablet Take 1 tablet by mouth 2 (two) times daily. Patient not taking: Reported on 03/09/2022 01/24/22   Eugenia Pancoast, FNP    Allergies as of 02/16/2022 - Review Complete 01/24/2022  Allergen Reaction Noted   Hydrochlorothiazide Other (See Comments) 01/24/2022   Influenza a (h1n1) monovalent vaccine Other (See Comments) 01/24/2022    Family History  Problem Relation Age of Onset   Cancer - Lung Mother        smokers   Mesothelioma Father        worked in [place]   Alcohol abuse Father    Breast cancer Daughter 55       And again 5 years later    Social History   Socioeconomic History   Marital status: Legally Separated    Spouse name: Not on file   Number of children: 1  Years of education: Not on file   Highest education level: Not on file  Occupational History   Occupation: retired  Tobacco Use   Smoking status: Never   Smokeless tobacco: Never  Vaping Use   Vaping Use: Never used  Substance and Sexual Activity   Alcohol use: No   Drug use: No   Sexual activity: Not Currently  Other Topics Concern   Not on file  Social History Narrative   Not on file   Social Determinants of Health   Financial Resource Strain: Not on file  Food Insecurity: Not on file  Transportation Needs: Not on file  Physical Activity: Not on file  Stress: Not on file  Social  Connections: Not on file  Intimate Partner Violence: Not on file    Review of Systems: See HPI, otherwise negative ROS  Physical Exam: There were no vitals taken for this visit. General:   Alert,  pleasant and cooperative in NAD Head:  Normocephalic and atraumatic. Neck:  Supple; no masses or thyromegaly. Lungs:  Clear throughout to auscultation, normal respiratory effort.    Heart:  +S1, +S2, Regular rate and rhythm, No edema. Abdomen:  Soft, nontender and nondistended. Normal bowel sounds, without guarding, and without rebound.   Neurologic:  Alert and  oriented x4;  grossly normal neurologically.  Impression/Plan: ARGELIA FORMISANO is here for an colonoscopy to be performed for positive cologaurd Risks, benefits, limitations, and alternatives regarding  colonoscopy have been reviewed with the patient.  Questions have been answered.  All parties agreeable.   Jonathon Bellows, MD  03/09/2022, 7:49 AM \

## 2022-03-09 NOTE — Transfer of Care (Signed)
Immediate Anesthesia Transfer of Care Note  Patient: Stacy Torres  Procedure(s) Performed: COLONOSCOPY WITH PROPOFOL  Patient Location: Endoscopy Unit  Anesthesia Type:General  Level of Consciousness: drowsy  Airway & Oxygen Therapy: Patient Spontanous Breathing  Post-op Assessment: Report given to RN and Post -op Vital signs reviewed and stable  Post vital signs: Reviewed and stable  Last Vitals:  Vitals Value Taken Time  BP    Temp    Pulse 95 03/09/22 0857  Resp 22 03/09/22 0857  SpO2 97 % 03/09/22 0857  Vitals shown include unvalidated device data.  Last Pain:  Vitals:   03/09/22 0752  TempSrc: Temporal  PainSc: 0-No pain         Complications: No notable events documented.

## 2022-03-09 NOTE — Op Note (Signed)
[facility] Gastroenterology Patient Name: Stacy Torres Procedure Date: 03/09/2022 8:30 AM MRN: [id number] Account #: [id number] Date of Birth: July 04, 1960 Admit Type: Outpatient Age: 61 Room: [facility] ENDO ROOM 4 Gender: Female Note Status: Finalized Instrument Name: Jasper Riling [id number] Procedure:             Colonoscopy Indications:           Positive Cologuard test Providers:             Jonathon Bellows MD, MD Referring MD:          Eugenia Pancoast (Referring MD) Medicines:             Monitored Anesthesia Care Complications:         No immediate complications. Procedure:             Pre-Anesthesia Assessment:                        - Prior to the procedure, a History and Physical was                         performed, and patient medications, allergies and                         sensitivities were reviewed. The patient's tolerance                         of previous anesthesia was reviewed.                        - The risks and benefits of the procedure and the                         sedation options and risks were discussed with the                         patient. All questions were answered and informed                         consent was obtained.                        - ASA Grade Assessment: II - A patient with mild                         systemic disease.                        After obtaining informed consent, the colonoscope was                         passed under direct vision. Throughout the procedure,                         the patient's blood pressure, pulse, and oxygen                         saturations were monitored continuously. The                         Colonoscope was introduced through the anus  and                         advanced to the the cecum, identified by the                         appendiceal orifice. The colonoscopy was performed                         without difficulty. The patient tolerated the                          procedure well. The quality of the bowel preparation                         was excellent. The ileocecal valve, appendiceal                         orifice, and rectum were photographed. Findings:      The perianal and digital rectal examinations were normal.      A 6 mm polyp was found in the transverse colon. The polyp was sessile.       The polyp was removed with a cold snare. Resection and retrieval were       complete.      A 20 mm polyp was found in the sigmoid colon. The polyp was       pedunculated. The polyp was removed with a hot snare. Resection and       retrieval were complete. To prevent bleeding after the polypectomy, one       hemostatic clip was successfully placed. There was no bleeding at the       end of the procedure.      The exam was otherwise without abnormality on direct and retroflexion       views.      Multiple medium-mouthed diverticula were found in the entire colon. Impression:            - One 6 mm polyp in the transverse colon, removed with                         a cold snare. Resected and retrieved.                        - One 20 mm polyp in the sigmoid colon, removed with a                         hot snare. Resected and retrieved. Clip was placed.                        - The examination was otherwise normal on direct and                         retroflexion views. Recommendation:        - Discharge patient to home (with escort).                        - Resume previous diet.                        -  Continue present medications.                        - Await pathology results.                        - Repeat colonoscopy in 3 years for surveillance. Procedure Code(s):     --- Professional ---                        [phone], Colonoscopy, flexible; with removal of                         tumor(s), polyp(s), or other lesion(s) by snare                         technique Diagnosis Code(s):     --- Professional ---                        D12.3, Benign  neoplasm of transverse colon (hepatic                         flexure or splenic flexure)                        D12.5, Benign neoplasm of sigmoid colon                        R19.5, Other fecal abnormalities CPT copyright 2022 American Medical Association. All rights reserved. The codes documented in this report are preliminary and upon coder review may  be revised to meet current compliance requirements. Jonathon Bellows, MD Jonathon Bellows MD, MD 03/09/2022 8:55:44 AM This report has been signed electronically. Number of Addenda: 0 Note Initiated On: 03/09/2022 8:30 AM Scope Withdrawal Time: 0 hours 11 minutes 43 seconds  Total Procedure Duration: 0 hours 13 minutes 13 seconds  Estimated Blood Loss:  Estimated blood loss: none.      [facility]

## 2022-03-09 NOTE — Anesthesia Preprocedure Evaluation (Signed)
Anesthesia Evaluation  Patient identified by MRN, date of birth, ID band Patient awake    Reviewed: Allergy & Precautions, NPO status , Patient's Chart, lab work & pertinent test results  History of Anesthesia Complications Negative for: history of anesthetic complications  Airway Mallampati: III  TM Distance: <3 FB Neck ROM: full    Dental  (+) Chipped, Poor Dentition, Missing   Pulmonary neg pulmonary ROS, neg shortness of breath   Pulmonary exam normal        Cardiovascular Exercise Tolerance: Good hypertension, (-) angina Normal cardiovascular exam     Neuro/Psych negative neurological ROS  negative psych ROS   GI/Hepatic Neg liver ROS,GERD  Controlled,,  Endo/Other  diabetes, Type 2    Renal/GU negative Renal ROS  negative genitourinary   Musculoskeletal   Abdominal   Peds  Hematology negative hematology ROS (+)   Anesthesia Other Findings Past Medical History: No date: Anxiety     Comment:  Phreesia 10/23/2019 No date: Arthritis     Comment:  Phreesia 10/23/2019 No date: Diabetes mellitus without complication ([facility]) No date: Hyperlipidemia     Comment:  Phreesia 10/23/2019 No date: Hypertension  Past Surgical History: No date: ABDOMINAL HYSTERECTOMY     Comment:  complete No date: ABDOMINAL HYSTERECTOMY  BMI    Body Mass Index: 36.79 kg/m      Reproductive/Obstetrics negative OB ROS                             Anesthesia Physical Anesthesia Plan  ASA: 3  Anesthesia Plan: General   Post-op Pain Management:    Induction: Intravenous  PONV Risk Score and Plan: Propofol infusion and TIVA  Airway Management Planned: Natural Airway and Nasal Cannula  Additional Equipment:   Intra-op Plan:   Post-operative Plan:   Informed Consent: I have reviewed the patients History and Physical, chart, labs and discussed the procedure including the risks, benefits and  alternatives for the proposed anesthesia with the patient or authorized representative who has indicated his/her understanding and acceptance.     Dental Advisory Given  Plan Discussed with: Anesthesiologist, CRNA and Surgeon  Anesthesia Plan Comments: (Patient consented for risks of anesthesia including but not limited to:  - adverse reactions to medications - risk of airway placement if required - damage to eyes, teeth, lips or other oral mucosa - nerve damage due to positioning  - sore throat or hoarseness - Damage to heart, brain, nerves, lungs, other parts of body or loss of life  Patient voiced understanding.)       Anesthesia Quick Evaluation

## 2022-03-10 ENCOUNTER — Encounter: Payer: Self-pay | Admitting: Gastroenterology

## 2022-03-10 LAB — SURGICAL PATHOLOGY

## 2022-03-10 NOTE — Progress Notes (Signed)
Noted  

## 2022-03-13 ENCOUNTER — Encounter: Payer: Self-pay | Admitting: Gastroenterology

## 2022-03-22 ENCOUNTER — Telehealth: Payer: Self-pay | Admitting: Family

## 2022-03-22 DIAGNOSIS — I10 Essential (primary) hypertension: Secondary | ICD-10-CM

## 2022-03-22 DIAGNOSIS — E7849 Other hyperlipidemia: Secondary | ICD-10-CM

## 2022-03-22 MED ORDER — ATENOLOL 50 MG PO TABS: 50.0000 mg | ORAL_TABLET | Freq: Every day | ORAL | 0 refills | Status: DC

## 2022-03-22 MED ORDER — ATORVASTATIN CALCIUM 40 MG PO TABS: 40.0000 mg | ORAL_TABLET | Freq: Every day | ORAL | 0 refills | Status: DC

## 2022-03-22 NOTE — Telephone Encounter (Signed)
  Encourage patient to contact the pharmacy for refills or they can request refills through [facility]  Did the patient contact the pharmacy:  no   LAST APPOINTMENT DATE:  Please schedule appointment if longer than 1 year  NEXT APPOINTMENT DATE:05/03/2022  MEDICATION:atenolol atenolol (TENORMIN) 50 MG tablet atorvastatin atorvastatin (LIPITOR) 40 MG tablet   Is the patient out of medication? Yes   If not, how much is left?  Is this a 90 day supply: yes  PHARMACY:[facility] - [place], [place] Phone: [phone]  Fax: [phone]    Let patient know to contact pharmacy at the end of the day to make sure medication is ready.  Please notify patient to allow 48-72 hours to process

## 2022-03-22 NOTE — Telephone Encounter (Signed)
Refills sent to pharmacy. 

## 2022-04-12 ENCOUNTER — Other Ambulatory Visit: Payer: Self-pay | Admitting: Family

## 2022-04-12 DIAGNOSIS — K219 Gastro-esophageal reflux disease without esophagitis: Secondary | ICD-10-CM

## 2022-05-03 ENCOUNTER — Encounter: Payer: Self-pay | Admitting: Family

## 2022-05-03 ENCOUNTER — Ambulatory Visit: Payer: Medicaid Other | Admitting: Family

## 2022-05-03 ENCOUNTER — Encounter: Payer: Self-pay | Admitting: *Deleted

## 2022-05-03 VITALS — BP 124/74 | HR 65 | Temp 98.4°F | Ht 60.0 in | Wt 169.4 lb

## 2022-05-03 DIAGNOSIS — K219 Gastro-esophageal reflux disease without esophagitis: Secondary | ICD-10-CM

## 2022-05-03 DIAGNOSIS — E119 Type 2 diabetes mellitus without complications: Secondary | ICD-10-CM

## 2022-05-03 DIAGNOSIS — E1040 Type 1 diabetes mellitus with diabetic neuropathy, unspecified: Secondary | ICD-10-CM

## 2022-05-03 DIAGNOSIS — E7849 Other hyperlipidemia: Secondary | ICD-10-CM

## 2022-05-03 DIAGNOSIS — H9193 Unspecified hearing loss, bilateral: Secondary | ICD-10-CM | POA: Insufficient documentation

## 2022-05-03 DIAGNOSIS — E1140 Type 2 diabetes mellitus with diabetic neuropathy, unspecified: Secondary | ICD-10-CM | POA: Diagnosis not present

## 2022-05-03 DIAGNOSIS — Z860100 Personal history of colon polyps, unspecified: Secondary | ICD-10-CM

## 2022-05-03 DIAGNOSIS — L409 Psoriasis, unspecified: Secondary | ICD-10-CM | POA: Diagnosis not present

## 2022-05-03 DIAGNOSIS — I10 Essential (primary) hypertension: Secondary | ICD-10-CM

## 2022-05-03 LAB — POCT GLYCOSYLATED HEMOGLOBIN (HGB A1C): Hemoglobin A1C: 6.8 % — AB (ref 4.0–5.6)

## 2022-05-03 LAB — MICROALBUMIN / CREATININE URINE RATIO
Creatinine,U: 47.9 mg/dL
Microalb Creat Ratio: 1.5 mg/g (ref 0.0–30.0)
Microalb, Ur: 0.7 mg/dL (ref 0.0–1.9)

## 2022-05-03 MED ORDER — OMEPRAZOLE 20 MG PO CPDR: DELAYED_RELEASE_CAPSULE | ORAL | 3 refills | Status: DC

## 2022-05-03 MED ORDER — METFORMIN HCL 1000 MG PO TABS: 1000.0000 mg | ORAL_TABLET | Freq: Two times a day (BID) | ORAL | 3 refills | Status: DC

## 2022-05-03 MED ORDER — ATORVASTATIN CALCIUM 40 MG PO TABS: 40.0000 mg | ORAL_TABLET | Freq: Every day | ORAL | 3 refills | Status: DC

## 2022-05-03 MED ORDER — LISINOPRIL 40 MG PO TABS: 40.0000 mg | ORAL_TABLET | Freq: Every day | ORAL | 3 refills | Status: DC

## 2022-05-03 MED ORDER — ATENOLOL 50 MG PO TABS: 50.0000 mg | ORAL_TABLET | Freq: Every day | ORAL | 3 refills | Status: DC

## 2022-05-03 MED ORDER — GLIPIZIDE 10 MG PO TABS: 10.0000 mg | ORAL_TABLET | Freq: Every day | ORAL | 3 refills | Status: DC

## 2022-05-03 NOTE — Assessment & Plan Note (Signed)
Continue with cream as prescribed.

## 2022-05-03 NOTE — Assessment & Plan Note (Signed)
Ordered hga1c today pending results. Work on diabetic diet and exercise as tolerated. Yearly foot exam, and annual eye exam.   

## 2022-05-03 NOTE — Assessment & Plan Note (Signed)
Referral to ENT for eval/treat

## 2022-05-03 NOTE — Progress Notes (Addendum)
Established Patient Office Visit  Subjective:      CC:  Chief Complaint  Patient presents with   Medical Management of Chronic Issues    Need refills for 90 days.    HPI: Stacy Torres is a 62 y.o. female presenting on 05/03/2022 for Medical Management of Chronic Issues (Need refills for 90 days.) . Acute concerns, would like a hearing test. She states hearing is starting to worsen.   Colonoscopy: 03/09/22 for positive cologuard. Polyp removed, pt to repeat in five years.   DM2: on metformin 1000 mg po bid and also on glipizide 10 mg twice daily.  Doesn't check her fasting glucose. She tried tradjenta for about two weeks but made her nauseous all of the time. Pt states working on her diet, stays active keeps her great grandson three times a week and often on her feet.   Lab Results  Component Value Date   HGBA1C 6.8 (A) 05/03/2022   GERD: pt states when she stays on omeprazole 20 mg and manages her diet she has relief of her GERD symptoms.      Social history:  Relevant past medical, surgical, family and social history reviewed and updated as indicated. Interim medical history since our last visit reviewed.  Allergies and medications reviewed and updated.  DATA REVIEWED: CHART IN EPIC     ROS: Negative unless specifically indicated above in HPI.    Current Outpatient Medications:    Accu-Chek FastClix Lancets MISC, Check fasting blood sugar daily. Dx: E11.9, Disp: 102 each, Rfl: 2   Aspirin 81 MG CAPS, Take by mouth., Disp: , Rfl:    atenolol (TENORMIN) 50 MG tablet, Take 1 tablet (50 mg total) by mouth daily. for blood pressure., Disp: 90 tablet, Rfl: 3   atorvastatin (LIPITOR) 40 MG tablet, Take 1 tablet (40 mg total) by mouth daily. for cholesterol., Disp: 90 tablet, Rfl: 3   blood glucose meter kit and supplies KIT, Dispense based on patient and insurance preference. Use up to BID  times daily as directed to check blood sugar. E11.89, Disp: 1 each, Rfl:  0   clobetasol cream (TEMOVATE) [id number] %, Apply 1 Application topically 2 (two) times daily., Disp: 30 g, Rfl: 0   clotrimazole-betamethasone (LOTRISONE) cream, APPLY TO AFFECTED AREA TWICE A DAY, Disp: 30 g, Rfl: 0   glipiZIDE (GLUCOTROL) 10 MG tablet, Take 1 tablet (10 mg total) by mouth daily before breakfast., Disp: 90 tablet, Rfl: 3   glucose blood (ACCU-CHEK GUIDE) test strip, Check fasting blood sugar daily. Dx: E11.9, Disp: 100 each, Rfl: 3   Lancets Misc. (ACCU-CHEK FASTCLIX LANCET) KIT, Check fasting blood sugar daily. Dx: E11.9, Disp: 1 kit, Rfl: 0   linagliptin (TRADJENTA) 5 MG TABS tablet, Take 1 tablet (5 mg total) by mouth daily., Disp: 90 tablet, Rfl: 1   lisinopril (ZESTRIL) 40 MG tablet, Take 1 tablet (40 mg total) by mouth daily., Disp: 90 tablet, Rfl: 3   metFORMIN (GLUCOPHAGE) 1000 MG tablet, Take 1 tablet (1,000 mg total) by mouth 2 (two) times daily with a meal., Disp: 180 tablet, Rfl: 3   omeprazole (PRILOSEC) 20 MG capsule, Take one po qd, Disp: 90 capsule, Rfl: 3      Objective:    BP 124/74   Pulse 65   Temp 98.4 F (36.9 C) (Oral)   Ht 5' (1.524 m)   Wt 169 lb 6.4 oz (76.8 kg)   SpO2 98%   BMI 33.08 kg/m   Wt Readings from  Last 3 Encounters:  05/03/22 169 lb 6.4 oz (76.8 kg)  03/09/22 188 lb 6.1 oz (85.5 kg)  01/24/22 166 lb (75.3 kg)    Physical Exam Constitutional:      General: She is not in acute distress.    Appearance: Normal appearance. She is normal weight. She is not ill-appearing, toxic-appearing or diaphoretic.  HENT:     Head: Normocephalic.     Right Ear: Tympanic membrane, ear canal and external ear normal. There is no impacted cerumen.     Left Ear: Tympanic membrane, ear canal and external ear normal. There is no impacted cerumen.  Cardiovascular:     Rate and Rhythm: Normal rate.  Pulmonary:     Effort: Pulmonary effort is normal.  Musculoskeletal:        General: Normal range of motion.  Neurological:     General: No focal  deficit present.     Mental Status: She is alert and oriented to person, place, and time. Mental status is at baseline.  Psychiatric:        Mood and Affect: Mood normal.        Behavior: Behavior normal.        Thought Content: Thought content normal.        Judgment: Judgment normal.           Assessment & Plan:  Type 2 diabetes mellitus with diabetic neuropathy, without long-term current use of insulin (HCC) Assessment & Plan: Ordered hga1c today pending results. Work on diabetic diet and exercise as tolerated. Yearly foot exam, and annual eye exam.     Essential hypertension -     Atenolol; Take 1 tablet (50 mg total) by mouth daily. for blood pressure.  Dispense: 90 tablet; Refill: 3 -     Lisinopril; Take 1 tablet (40 mg total) by mouth daily.  Dispense: 90 tablet; Refill: 3  Other hyperlipidemia -     Atorvastatin Calcium; Take 1 tablet (40 mg total) by mouth daily. for cholesterol.  Dispense: 90 tablet; Refill: 3  Type 2 diabetes mellitus without complication, without long-term current use of insulin ([place]) Assessment & Plan: Ordered hga1c today pending results. Work on diabetic diet and exercise as tolerated. Yearly foot exam, and annual eye exam.    Orders: -     metFORMIN HCl; Take 1 tablet (1,000 mg total) by mouth 2 (two) times daily with a meal.  Dispense: 180 tablet; Refill: 3 -     Microalbumin / creatinine urine ratio -     POCT glycosylated hemoglobin (Hb A1C)  Gastroesophageal reflux disease without esophagitis -     Omeprazole; Take one po qd  Dispense: 90 capsule; Refill: 3  Bilateral hearing loss, unspecified hearing loss type Assessment & Plan: Referral to ENT for eval/treat  Orders: -     Ambulatory referral to ENT  Primary hypertension Assessment & Plan: Stable Continue medications as prescribed.   History of colon polyps  Psoriasis Assessment & Plan: Continue with cream as prescribed.   Other orders -     glipiZIDE; Take 1 tablet  (10 mg total) by mouth daily before breakfast.  Dispense: 90 tablet; Refill: 3     Return in about 3 months (around 08/01/2022) for f/u diabetes.  Eugenia Pancoast, MSN, APRN, FNP-C [place]

## 2022-05-03 NOTE — Assessment & Plan Note (Signed)
Stable.  Continue medications as prescribed. 

## 2022-05-03 NOTE — Patient Instructions (Addendum)
A referral was placed today for ENT for your hearing loss. Please let us know if you have not heard back within 2 weeks about the referral.  Goal is to work on diet in regards to heartburn symptoms. The end goal is to try to get off of the omeprazole to avoid osteoporosis and or bone degeneration.   Regards,   Eugenia Pancoast FNP-C

## 2022-05-16 ENCOUNTER — Encounter: Payer: Self-pay | Admitting: Family

## 2022-05-29 ENCOUNTER — Encounter: Payer: Self-pay | Admitting: Family

## 2022-05-29 DIAGNOSIS — K219 Gastro-esophageal reflux disease without esophagitis: Secondary | ICD-10-CM

## 2022-05-30 MED ORDER — OMEPRAZOLE 20 MG PO CPDR: DELAYED_RELEASE_CAPSULE | ORAL | 3 refills | Status: DC

## 2022-05-30 NOTE — Addendum Note (Signed)
Addended by: Eugenia Pancoast on: 05/30/2022 02:05 PM   Modules accepted: Orders

## 2022-08-07 ENCOUNTER — Ambulatory Visit
Admission: EM | Admit: 2022-08-07 | Discharge: 2022-08-07 | Disposition: A | Discharge status: Home / Self Care | Payer: Medicaid Other | Attending: Urgent Care | Admitting: Urgent Care

## 2022-08-07 DIAGNOSIS — H1031 Unspecified acute conjunctivitis, right eye: Secondary | ICD-10-CM

## 2022-08-07 MED ORDER — OLOPATADINE HCL 0.2 % OP SOLN: 1.0000 [drp] | Freq: Two times a day (BID) | OPHTHALMIC | 0 refills | Status: DC

## 2022-08-07 MED ORDER — KETOROLAC TROMETHAMINE 0.5 % OP SOLN: 1.0000 [drp] | Freq: Four times a day (QID) | OPHTHALMIC | 0 refills | Status: DC

## 2022-08-07 NOTE — Discharge Instructions (Addendum)
Use olopatadine drops to prevent drainage. Use ketorolac drops for pain.

## 2022-08-07 NOTE — ED Provider Notes (Signed)
Stacy Torres    CSN: [id number] Arrival date & time: 08/07/22  1831      History   Chief Complaint Chief Complaint  Patient presents with   Eye Problem    HPI Stacy Torres is a 62 y.o. female.    Eye Problem   Presents to urgent care with complaint of redness, drainage, pain in her right eye starting this morning.  Denies injury or precipitating event.  Has been using OTC drops.  Denies vision changes.    Past Medical History:  Diagnosis Date   Anxiety    Phreesia 10/23/2019   Arthritis    Phreesia 10/23/2019   Diabetes mellitus without complication (HCC)    Hyperlipidemia    Phreesia 10/23/2019   Hypertension     Patient Active Problem List   Diagnosis Date Noted   Bilateral hearing loss 05/03/2022   History of colon polyps 05/03/2022   Type 2 diabetes mellitus with diabetic neuropathy, without long-term current use of insulin (HCC) 05/03/2022   Psoriasis 02/03/2015   Obesity (BMI 30-39.9) 03/21/2012   GERD (gastroesophageal reflux disease) 01/13/2012   Hyperlipidemia 06/10/2011   HTN (hypertension) 06/09/2011    Past Surgical History:  Procedure Laterality Date   ABDOMINAL HYSTERECTOMY     complete   ABDOMINAL HYSTERECTOMY     COLONOSCOPY WITH PROPOFOL N/A 03/09/2022   Procedure: COLONOSCOPY WITH PROPOFOL;  Surgeon: Wyline Mood, MD;  Location: [facility] ENDOSCOPY;  Service: Gastroenterology;  Laterality: N/A;    OB History   No obstetric history on file.      Home Medications    Prior to Admission medications   Medication Sig Start Date End Date Taking? Authorizing Provider  Accu-Chek FastClix Lancets MISC Check fasting blood sugar daily. Dx: E11.9 02/12/20   Arvilla Market, MD  Aspirin 81 MG CAPS Take by mouth.    [provider]  atenolol (TENORMIN) 50 MG tablet Take 1 tablet (50 mg total) by mouth daily. for blood pressure. 05/03/22   Mort Sawyers, FNP  atorvastatin (LIPITOR) 40 MG tablet Take 1 tablet (40 mg  total) by mouth daily. for cholesterol. 05/03/22   Mort Sawyers, FNP  blood glucose meter kit and supplies KIT Dispense based on patient and insurance preference. Use up to BID  times daily as directed to check blood sugar. E11.89 10/10/18   Bing Neighbors, NP  clobetasol cream (TEMOVATE) 0.05 % Apply 1 Application topically 2 (two) times daily. 01/24/22   Mort Sawyers, FNP  clotrimazole-betamethasone (LOTRISONE) cream APPLY TO AFFECTED AREA TWICE A DAY 09/01/20   Arvilla Market, MD  glipiZIDE (GLUCOTROL) 10 MG tablet Take 1 tablet (10 mg total) by mouth daily before breakfast. 05/03/22   Mort Sawyers, FNP  glucose blood (ACCU-CHEK GUIDE) test strip Check fasting blood sugar daily. Dx: E11.9 02/12/20   Arvilla Market, MD  Lancets Misc. (ACCU-CHEK FASTCLIX LANCET) KIT Check fasting blood sugar daily. Dx: E11.9 02/12/20   Arvilla Market, MD  linagliptin (TRADJENTA) 5 MG TABS tablet Take 1 tablet (5 mg total) by mouth daily. 01/31/22   Mort Sawyers, FNP  lisinopril (ZESTRIL) 40 MG tablet Take 1 tablet (40 mg total) by mouth daily. 05/03/22   Mort Sawyers, FNP  metFORMIN (GLUCOPHAGE) 1000 MG tablet Take 1 tablet (1,000 mg total) by mouth 2 (two) times daily with a meal. 05/03/22   Mort Sawyers, FNP  omeprazole (PRILOSEC) 20 MG capsule Take one po qd 05/30/22   Mort Sawyers, FNP    Family  History Family History  Problem Relation Age of Onset   Cancer - Lung Mother        smokers   Mesothelioma Father        worked in Ha[employer] mines   Alcohol abuse Father    Breast cancer Daughter 66       And again 5 years later    Social History Social History   Tobacco Use   Smoking status: Never   Smokeless tobacco: Never  Vaping Use   Vaping Use: Never used  Substance Use Topics   Alcohol use: No   Drug use: No     Allergies   Hydrochlorothiazide, Influenza a (h1n1) monovalent vaccine, and Tradjenta [linagliptin]   Review of Systems Review of  Systems   Physical Exam Triage Vital Signs ED Triage Vitals [08/07/22 1909]  Enc Vitals Group     BP      Pulse      Resp      Temp      Temp src      SpO2      Weight      Height      Head Circumference      Peak Flow      Pain Score 7     Pain Loc      Pain Edu?      Excl. in GC?    No data found.  Updated Vital Signs There were no vitals taken for this visit.  Visual Acuity Right Eye Distance:  (unable to read any lines) Left Eye Distance: 20/80 Bilateral Distance: 20/60  Right Eye Near:   Left Eye Near:    Bilateral Near:     Physical Exam Vitals reviewed.  Constitutional:      Appearance: Normal appearance.  Eyes:     General:        Right eye: Discharge present.  Skin:    General: Skin is warm and dry.  Neurological:     General: No focal deficit present.     Mental Status: She is alert and oriented to person, place, and time.  Psychiatric:        Mood and Affect: Mood normal.        Behavior: Behavior normal.      UC Treatments / Results  Labs (all labs ordered are listed, but only abnormal results are displayed) Labs Reviewed - No data to display  EKG   Radiology No results found.  Procedures Procedures (including critical care time)  Medications Ordered in UC Medications - No data to display  Initial Impression / Assessment and Plan / UC Course  I have reviewed the triage vital signs and the nursing notes.  Pertinent labs & imaging results that were available during my care of the patient were reviewed by me and considered in my medical decision making (see chart for details).   Conjunctivitis of the right eye, clear drainage, viral versus allergic.  Treating with Pataday and ketorolac drops.  Counseled patient on potential for adverse effects with medications prescribed/recommended today, ER and return-to-clinic precautions discussed, patient verbalized understanding and agreement with care plan.   Final Clinical  Impressions(s) / UC Diagnoses   Final diagnoses:  None   Discharge Instructions   None    ED Prescriptions   None    PDMP not reviewed this encounter.   Charma Igo, [place] 08/07/22 1933

## 2022-08-07 NOTE — ED Triage Notes (Addendum)
Patient presents to [facility] for right eye problem. Reports redness, drainage, and pain since this morning. No eye injury that she recalls. Treating with OTC eye drops to flush eye. No changes to vision.

## 2022-08-15 ENCOUNTER — Other Ambulatory Visit: Payer: Self-pay | Admitting: Family

## 2022-08-15 DIAGNOSIS — L409 Psoriasis, unspecified: Secondary | ICD-10-CM

## 2022-08-15 MED ORDER — CLOBETASOL PROPIONATE 0.05 % EX CREA: 1.0000 | TOPICAL_CREAM | Freq: Two times a day (BID) | CUTANEOUS | 0 refills | Status: AC

## 2022-08-21 ENCOUNTER — Encounter: Payer: Self-pay | Admitting: Family

## 2022-08-31 ENCOUNTER — Ambulatory Visit: Payer: Medicaid Other | Admitting: Family

## 2022-08-31 ENCOUNTER — Encounter: Payer: Self-pay | Admitting: Family

## 2022-08-31 VITALS — BP 122/64 | HR 80 | Temp 98.1°F | Ht 60.0 in | Wt 165.0 lb

## 2022-08-31 DIAGNOSIS — N3000 Acute cystitis without hematuria: Secondary | ICD-10-CM

## 2022-08-31 DIAGNOSIS — H5461 Unqualified visual loss, right eye, normal vision left eye: Secondary | ICD-10-CM | POA: Diagnosis not present

## 2022-08-31 DIAGNOSIS — R300 Dysuria: Secondary | ICD-10-CM | POA: Diagnosis not present

## 2022-08-31 DIAGNOSIS — E782 Mixed hyperlipidemia: Secondary | ICD-10-CM | POA: Diagnosis not present

## 2022-08-31 DIAGNOSIS — E1140 Type 2 diabetes mellitus with diabetic neuropathy, unspecified: Secondary | ICD-10-CM | POA: Diagnosis not present

## 2022-08-31 DIAGNOSIS — H53131 Sudden visual loss, right eye: Secondary | ICD-10-CM | POA: Diagnosis not present

## 2022-08-31 LAB — POC URINALSYSI DIPSTICK (AUTOMATED)
Bilirubin, UA: NEGATIVE
Blood, UA: NEGATIVE
Glucose, UA: NEGATIVE
Ketones, UA: NEGATIVE
Leukocytes, UA: NEGATIVE
Nitrite, UA: NEGATIVE
Protein, UA: NEGATIVE
Spec Grav, UA: 1.025 (ref 1.010–1.025)
Urobilinogen, UA: 0.2 E.U./dL
pH, UA: 5.5 (ref 5.0–8.0)

## 2022-08-31 MED ORDER — CIPROFLOXACIN HCL 500 MG PO TABS: 500.0000 mg | ORAL_TABLET | Freq: Two times a day (BID) | ORAL | 0 refills | Status: AC

## 2022-08-31 MED ORDER — CIPROFLOXACIN HCL 500 MG PO TABS: 500.0000 mg | ORAL_TABLET | Freq: Two times a day (BID) | ORAL | 0 refills | Status: DC

## 2022-08-31 NOTE — Assessment & Plan Note (Signed)
Advised to stop ketoralac  Was able to get a stat appt with ophthalmologist, pt headed over there now (post appt)  Concern for dilation of right eye and constriction of left eye.  About 6 days of symptoms, so 24 hour window for possible stroke preventative overdue

## 2022-08-31 NOTE — Assessment & Plan Note (Signed)
Ordered hga1c today pending results. Work on diabetic diet and exercise as tolerated. Yearly foot exam, and annual eye exam.   

## 2022-08-31 NOTE — Progress Notes (Signed)
Established Patient Office Visit  Subjective:   Patient ID: Stacy Torres, female    DOB: 1960/05/03  Age: 62 y.o. MRN: [id number]  CC:  Chief Complaint  Patient presents with   Dysuria    X 1 week     HPI: Stacy Torres is a 62 y.o. female presenting on 08/31/2022 for Dysuria (X 1 week )  About a week ago started with dysuria, nauseous, back pain, chills and lower abdominal pain. Increased urinary frequency.   The back pain is worsened when she gets up and tries to walk.   Pt was seen 5/6 in urgent care for acute conjunctivitis was given ketorolac 0.5% solution and using which has helped with her grittiness but states has lost some eyesight in the right eye since. She thinks she has cataracts. With her right eye only she can not see much at all, states blurry and can not read and or see objects up close and or far away from her.   Lab Results  Component Value Date   HGBA1C 6.8 (A) 05/03/2022          ROS: Negative unless specifically indicated above in HPI.   Relevant past medical history reviewed and updated as indicated.   Allergies and medications reviewed and updated.   Current Outpatient Medications:    Accu-Chek FastClix Lancets MISC, Check fasting blood sugar daily. Dx: E11.9, Disp: 102 each, Rfl: 2   Aspirin 81 MG CAPS, Take by mouth., Disp: , Rfl:    atenolol (TENORMIN) 50 MG tablet, Take 1 tablet (50 mg total) by mouth daily. for blood pressure., Disp: 90 tablet, Rfl: 3   atorvastatin (LIPITOR) 40 MG tablet, Take 1 tablet (40 mg total) by mouth daily. for cholesterol., Disp: 90 tablet, Rfl: 3   blood glucose meter kit and supplies KIT, Dispense based on patient and insurance preference. Use up to BID  times daily as directed to check blood sugar. E11.89, Disp: 1 each, Rfl: 0   clobetasol cream (TEMOVATE) 0.05 %, Apply 1 Application topically 2 (two) times daily., Disp: 30 g, Rfl: 0   clotrimazole-betamethasone (LOTRISONE) cream, APPLY TO AFFECTED  AREA TWICE A DAY, Disp: 30 g, Rfl: 0   glipiZIDE (GLUCOTROL) 10 MG tablet, Take 1 tablet (10 mg total) by mouth daily before breakfast., Disp: 90 tablet, Rfl: 3   glucose blood (ACCU-CHEK GUIDE) test strip, Check fasting blood sugar daily. Dx: E11.9, Disp: 100 each, Rfl: 3   ketorolac (ACULAR) 0.5 % ophthalmic solution, Place 1 drop into the right eye every 6 (six) hours., Disp: 5 mL, Rfl: 0   Lancets Misc. (ACCU-CHEK FASTCLIX LANCET) KIT, Check fasting blood sugar daily. Dx: E11.9, Disp: 1 kit, Rfl: 0   lisinopril (ZESTRIL) 40 MG tablet, Take 1 tablet (40 mg total) by mouth daily., Disp: 90 tablet, Rfl: 3   metFORMIN (GLUCOPHAGE) 1000 MG tablet, Take 1 tablet (1,000 mg total) by mouth 2 (two) times daily with a meal., Disp: 180 tablet, Rfl: 3   Olopatadine HCl 0.2 % SOLN, Apply 1 drop to eye in the morning and at bedtime., Disp: 2.5 mL, Rfl: 0   omeprazole (PRILOSEC) 20 MG capsule, Take one po qd, Disp: 90 capsule, Rfl: 3   ciprofloxacin (CIPRO) 500 MG tablet, Take 1 tablet (500 mg total) by mouth 2 (two) times daily for 3 days., Disp: 6 tablet, Rfl: 0  Allergies  Allergen Reactions   Hydrochlorothiazide Other (See Comments)    Dizziness and nausea   Influenza A (  H1n1) Monovalent Vaccine Other (See Comments)    H/o gbs as a child   Tradjenta [Linagliptin] Nausea Only    Nausea     Objective:   BP 122/64   Pulse 80   Temp 98.1 F (36.7 C) (Temporal)   Ht 5' (1.524 m)   Wt 165 lb (74.8 kg)   SpO2 99%   BMI 32.22 kg/m    Physical Exam Constitutional:      General: She is not in acute distress.    Appearance: Normal appearance. She is normal weight. She is not ill-appearing, toxic-appearing or diaphoretic.  Eyes:     General: Lids are normal.     Comments: Right eye with dilation left eye with constriction  No discharge or erythema bil eyes  Peripheral vision intact   Cardiovascular:     Rate and Rhythm: Normal rate.  Pulmonary:     Effort: Pulmonary effort is normal.   Abdominal:     General: Abdomen is flat.     Tenderness: There is no abdominal tenderness. There is no right CVA tenderness or left CVA tenderness.  Neurological:     General: No focal deficit present.     Mental Status: She is alert and oriented to person, place, and time. Mental status is at baseline.     Cranial Nerves: Cranial nerves 2-12 are intact.     Sensory: Sensation is intact.     Motor: Motor function is intact. No weakness.     Coordination: Coordination is intact.     Gait: Gait is intact.  Psychiatric:        Mood and Affect: Mood normal.        Behavior: Behavior normal.        Thought Content: Thought content normal.        Judgment: Judgment normal.     Assessment & Plan:  Dysuria -     POCT Urinalysis Dipstick (Automated) -     Basic metabolic panel; Future -     CBC with Differential/Platelet; Future  Acute cystitis without hematuria Assessment & Plan: poct urine dip in office Urine culture ordered pending results antbx sent to pharmacy, pt to take as directed. Encouraged increased water intake throughout the day. Choosing to treat due to being symptomatic. If no improvement in the next 2 days pt advised to let me know. Rx cipro 500 mg po bid x 3 days Worry is for kidney infection so order bmp and cbc as well, pending results and choosing cipro for this concern.   Orders: -     Ciprofloxacin HCl; Take 1 tablet (500 mg total) by mouth 2 (two) times daily for 3 days.  Dispense: 6 tablet; Refill: 0 -     Basic metabolic panel; Future -     CBC with Differential/Platelet; Future -     Urinalysis w microscopic + reflex cultur; Future  Vision loss of right eye  Acute loss of vision, right Assessment & Plan: Advised to stop ketoralac  Was able to get a stat appt with ophthalmologist, pt headed over there now (post appt)  Concern for dilation of right eye and constriction of left eye.  About 6 days of symptoms, so 24 hour window for possible stroke  preventative overdue    Orders: -     Ambulatory referral to Ophthalmology  Type 2 diabetes mellitus with diabetic neuropathy, without long-term current use of insulin (HCC) Assessment & Plan: Ordered hga1c today pending results. Work on diabetic diet and  exercise as tolerated. Yearly foot exam, and annual eye exam.    Orders: -     Hemoglobin A1c; Future  Mixed hyperlipidemia -     Lipid panel; Future     Follow up plan: Return in about 3 months (around 12/01/2022) for f/u diabetes.  Mort Sawyers, FNP

## 2022-08-31 NOTE — Assessment & Plan Note (Addendum)
poct urine dip in office Urine culture ordered pending results antbx sent to pharmacy, pt to take as directed. Encouraged increased water intake throughout the day. Choosing to treat due to being symptomatic. If no improvement in the next 2 days pt advised to let me know. Rx cipro 500 mg po bid x 3 days Worry is for kidney infection so order bmp and cbc as well, pending results and choosing cipro for this concern.

## 2022-09-06 ENCOUNTER — Other Ambulatory Visit: Payer: Medicaid Other

## 2022-09-15 ENCOUNTER — Other Ambulatory Visit (INDEPENDENT_AMBULATORY_CARE_PROVIDER_SITE_OTHER): Payer: Medicaid Other

## 2022-09-15 ENCOUNTER — Ambulatory Visit: Payer: Medicaid Other | Admitting: Family

## 2022-09-15 DIAGNOSIS — E782 Mixed hyperlipidemia: Secondary | ICD-10-CM

## 2022-09-15 DIAGNOSIS — N3000 Acute cystitis without hematuria: Secondary | ICD-10-CM | POA: Diagnosis not present

## 2022-09-15 DIAGNOSIS — R300 Dysuria: Secondary | ICD-10-CM | POA: Diagnosis not present

## 2022-09-15 DIAGNOSIS — E1140 Type 2 diabetes mellitus with diabetic neuropathy, unspecified: Secondary | ICD-10-CM

## 2022-09-15 LAB — CBC WITH DIFFERENTIAL/PLATELET
Basophils Absolute: 0 10*3/uL (ref 0.0–0.1)
Basophils Relative: 0.3 % (ref 0.0–3.0)
Eosinophils Absolute: 0.4 10*3/uL (ref 0.0–0.7)
Eosinophils Relative: 5.5 % — ABNORMAL HIGH (ref 0.0–5.0)
HCT: 33.7 % — ABNORMAL LOW (ref 36.0–46.0)
Hemoglobin: 11 g/dL — ABNORMAL LOW (ref 12.0–15.0)
Lymphocytes Relative: 25.9 % (ref 12.0–46.0)
Lymphs Abs: 1.8 10*3/uL (ref 0.7–4.0)
MCHC: 32.8 g/dL (ref 30.0–36.0)
MCV: 96.5 fl (ref 78.0–100.0)
Monocytes Absolute: 0.6 10*3/uL (ref 0.1–1.0)
Monocytes Relative: 8.6 % (ref 3.0–12.0)
Neutro Abs: 4.1 10*3/uL (ref 1.4–7.7)
Neutrophils Relative %: 59.7 % (ref 43.0–77.0)
Platelets: 265 10*3/uL (ref 150.0–400.0)
RBC: 3.49 Mil/uL — ABNORMAL LOW (ref 3.87–5.11)
RDW: 14.2 % (ref 11.5–15.5)
WBC: 6.8 10*3/uL (ref 4.0–10.5)

## 2022-09-15 LAB — BASIC METABOLIC PANEL
BUN: 25 mg/dL — ABNORMAL HIGH (ref 6–23)
CO2: 25 mEq/L (ref 19–32)
Calcium: 9.3 mg/dL (ref 8.4–10.5)
Chloride: 105 mEq/L (ref 96–112)
Creatinine, Ser: 1.21 mg/dL — ABNORMAL HIGH (ref 0.40–1.20)
GFR: 47.46 mL/min — ABNORMAL LOW (ref >60.00)
Glucose, Bld: 94 mg/dL (ref 70–99)
Potassium: 4.3 mEq/L (ref 3.5–5.1)
Sodium: 137 mEq/L (ref 135–145)

## 2022-09-15 LAB — LIPID PANEL
Cholesterol: 208 mg/dL — ABNORMAL HIGH (ref 0–200)
HDL: 44.9 mg/dL (ref >39.00)
NonHDL: 163.46
Total CHOL/HDL Ratio: 5
Triglycerides: 214 mg/dL — ABNORMAL HIGH (ref 0.0–149.0)
VLDL: 42.8 mg/dL — ABNORMAL HIGH (ref 0.0–40.0)

## 2022-09-15 LAB — LDL CHOLESTEROL, DIRECT: Direct LDL: 124 mg/dL

## 2022-09-15 LAB — HEMOGLOBIN A1C: Hgb A1c MFr Bld: 7.5 % — ABNORMAL HIGH (ref 4.6–6.5)

## 2022-09-16 LAB — URINALYSIS W MICROSCOPIC + REFLEX CULTURE
Bacteria, UA: NONE SEEN /HPF
Bilirubin Urine: NEGATIVE
Glucose, UA: NEGATIVE
Hgb urine dipstick: NEGATIVE
Ketones, ur: NEGATIVE
Leukocyte Esterase: NEGATIVE
Nitrites, Initial: NEGATIVE
Protein, ur: NEGATIVE
RBC / HPF: NONE SEEN /HPF (ref 0–2)
Specific Gravity, Urine: 1.013 (ref 1.001–1.035)
pH: 5.5 (ref 5.0–8.0)

## 2022-09-16 LAB — NO CULTURE INDICATED

## 2022-09-18 NOTE — Progress Notes (Signed)
Anemia stable.  Hga1c much too high at 7.5, four months ago was 6.8. has pt ever taken a GLP 1 injection? I think this might be a good addition to diabetic control and would help with weight loss. This is meds such as ozempic, trulicity and or mounjaro.   Kidney function has taken a slight dip, are you having any urinary symptoms? Having any kidney stone symptoms? Inability to void, sharp back pain? Are you drinking a good amount of water?   Cholesterol also is much too high. Was pt fasting day of labs?

## 2022-09-19 IMAGING — DX DG STERNUM 2+V
2 series · 2 of 2 positions shown · non-contrast
Comparison: None.

CLINICAL DATA: Sternal lump.

EXAM:
STERNUM - 2+ VIEW

[chest pa]
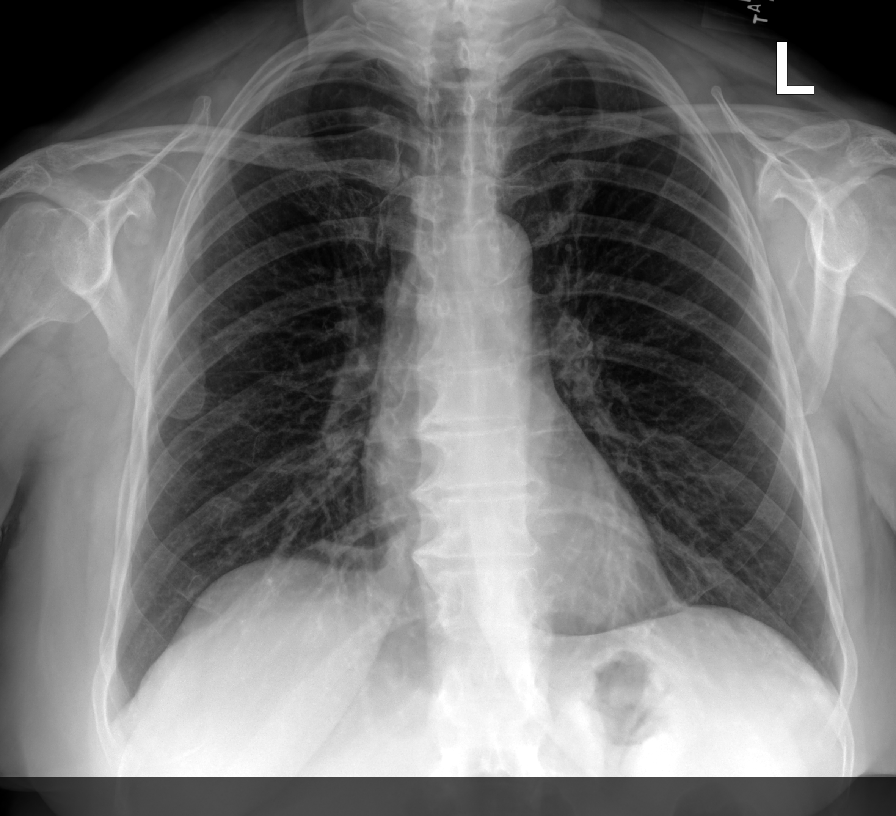

[sternum lateral]
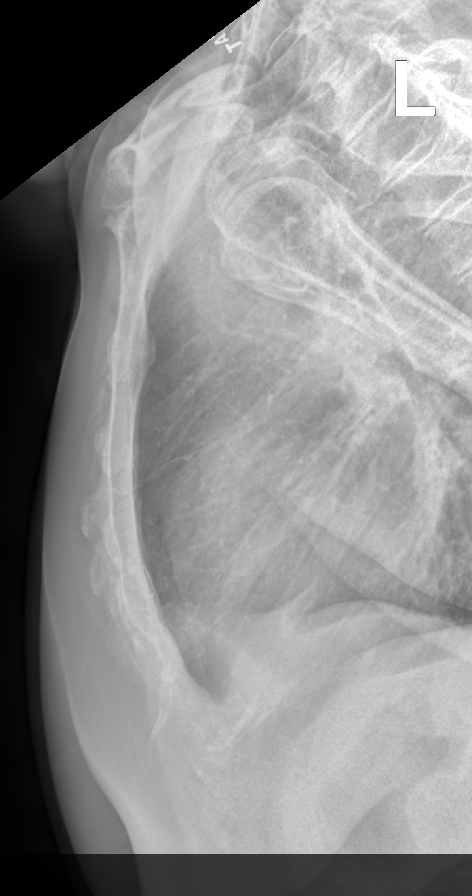

[2 of 2 positions shown; findings below may reference images not displayed]

FINDINGS: There is no evidence of fracture or other focal bone lesions.
IMPRESSION: Negative.

## 2022-09-28 NOTE — Progress Notes (Signed)
Yes ok to send letter. thanks

## 2022-10-03 IMAGING — US US SOFT TISSUE
1 series · 15 of 16 positions shown · non-contrast
Comparison: 09/10/2020

CLINICAL DATA: Chest lump at base of sternum

EXAM:
ULTRASOUND OF anterior chest wall SOFT TISSUES
TECHNIQUE: Ultrasound examination was performed in the area of clinical
concern.

[Series 1: us chest mc & wl · 22 acquisitions, 15 frames shown]
[im 1/22]
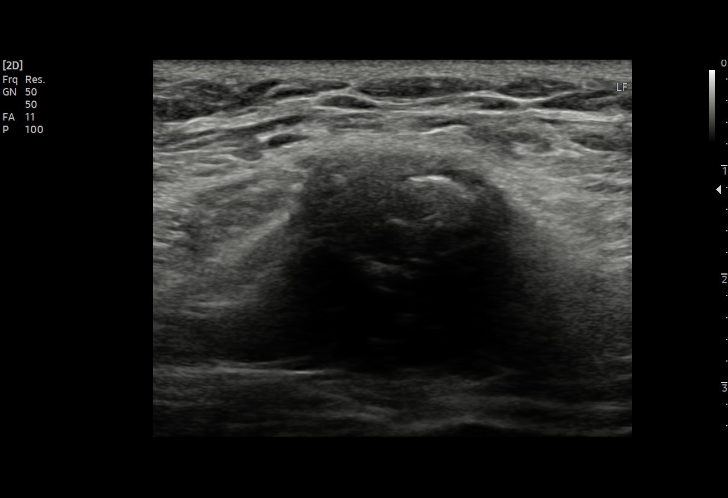
[im 2/22]
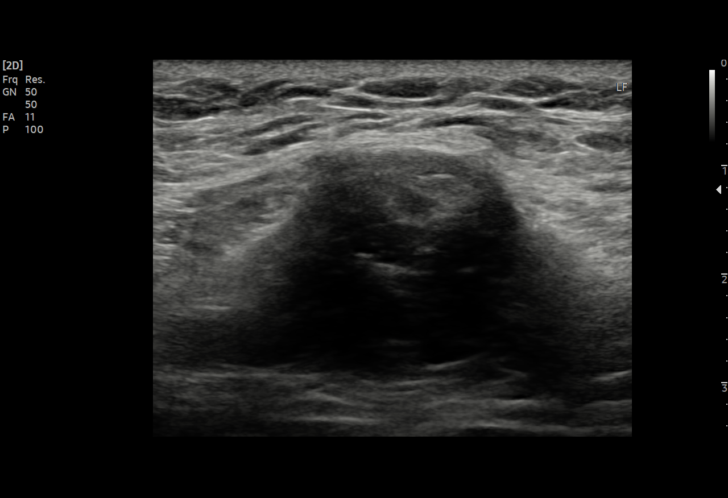
[im 3/22]
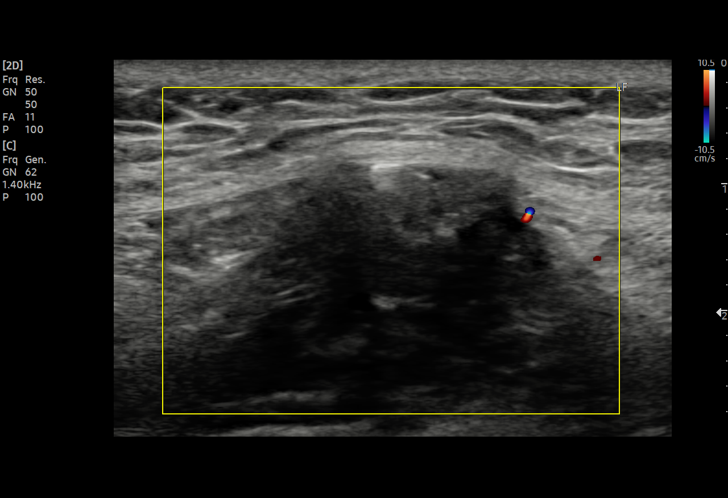
[im 5/22]
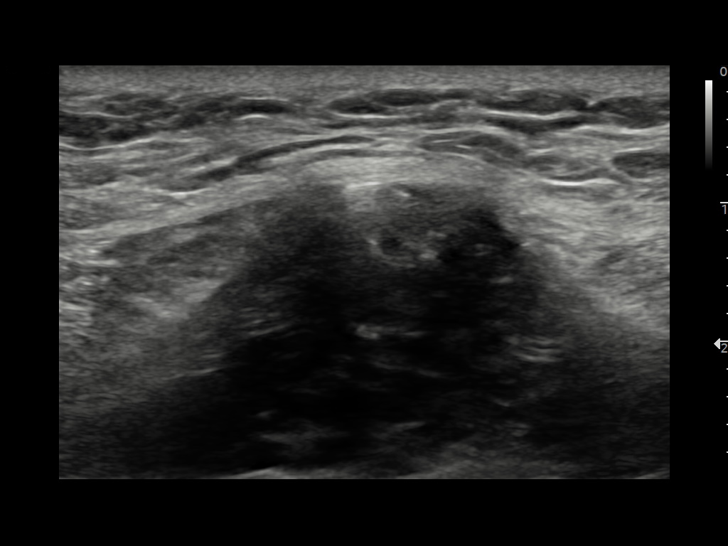
[im 6/22]
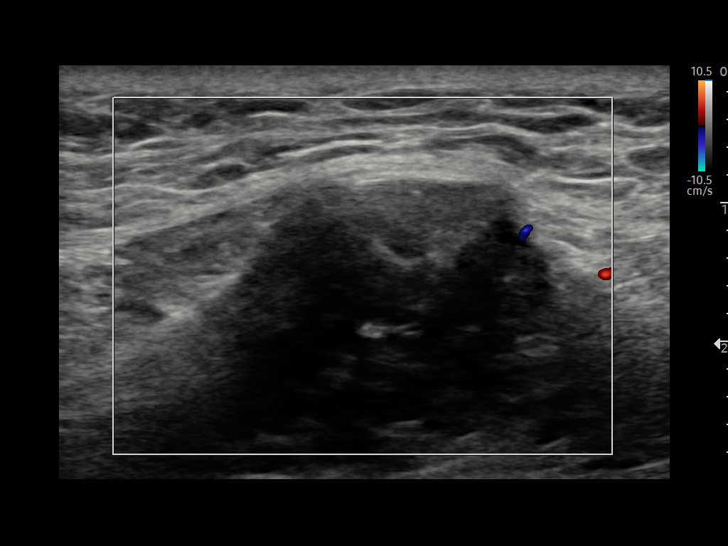
[im 8/22]
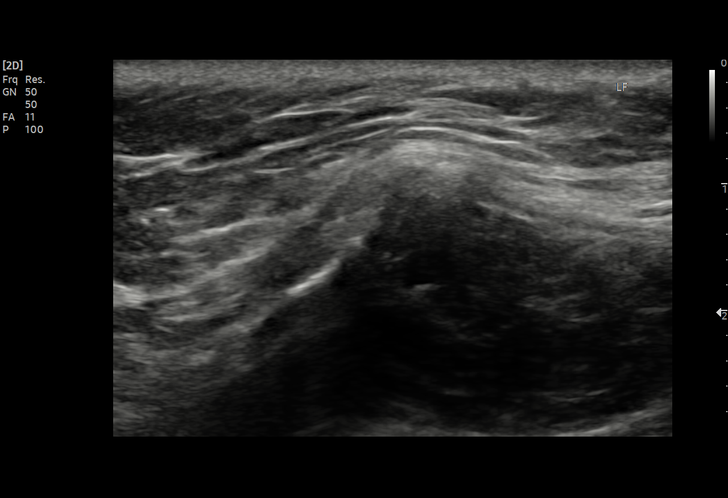
[im 9/22]
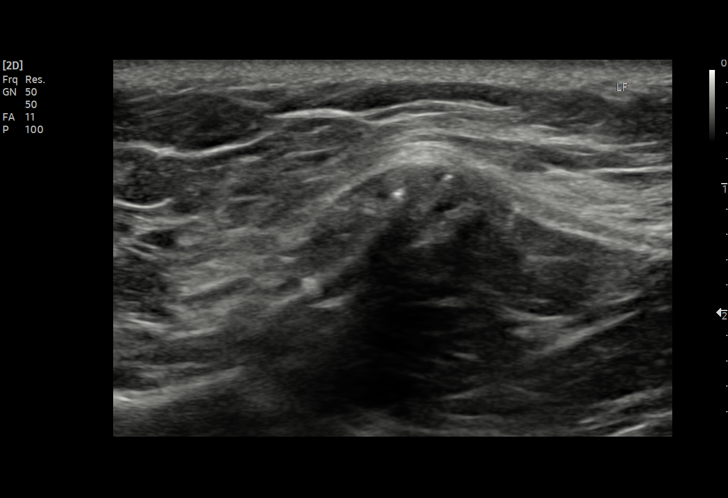
[im 12/22]
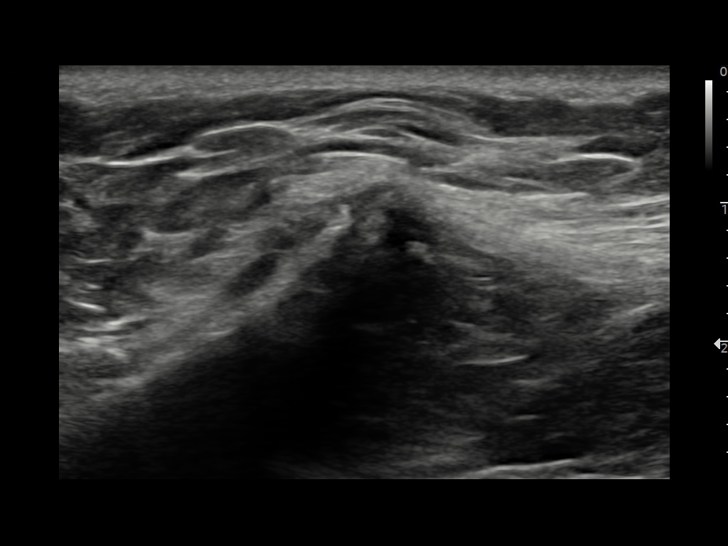
[im 13/22]
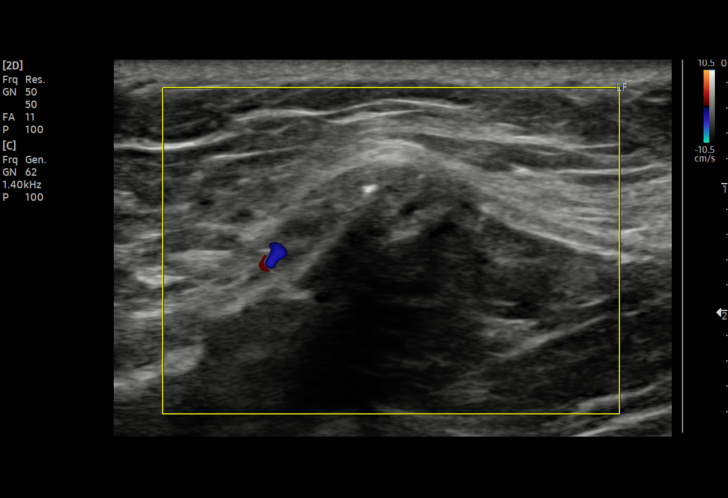
[im 15/22]
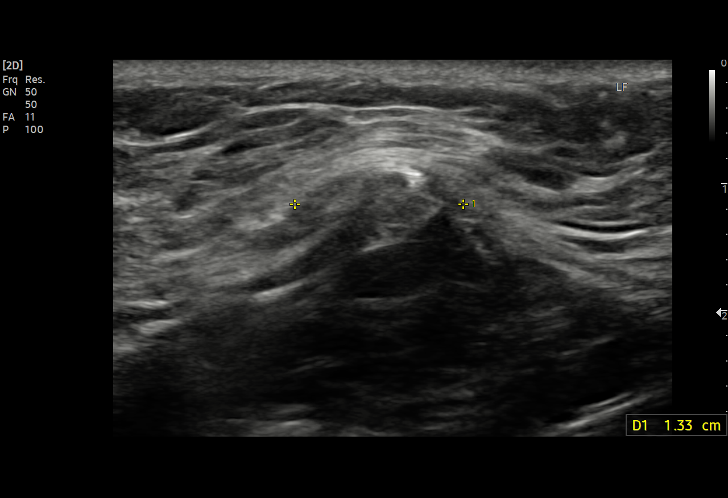
[im 16/22]
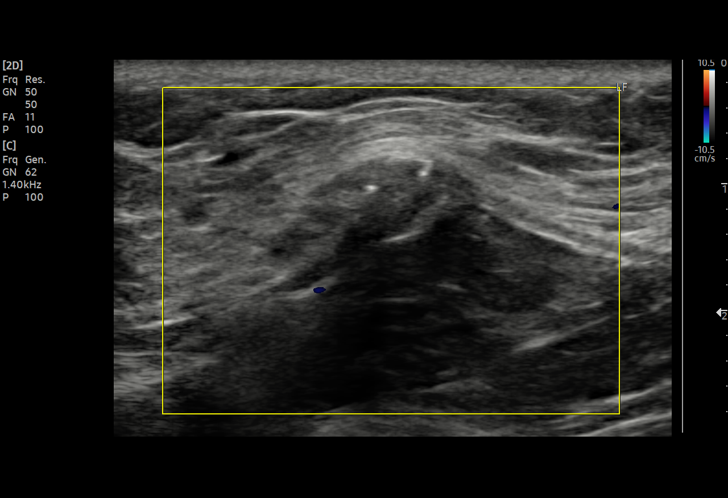
[im 17/22]
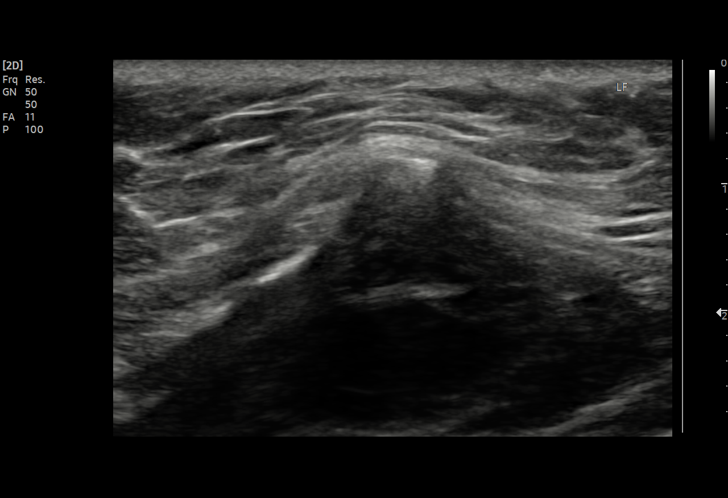
[im 19/22]
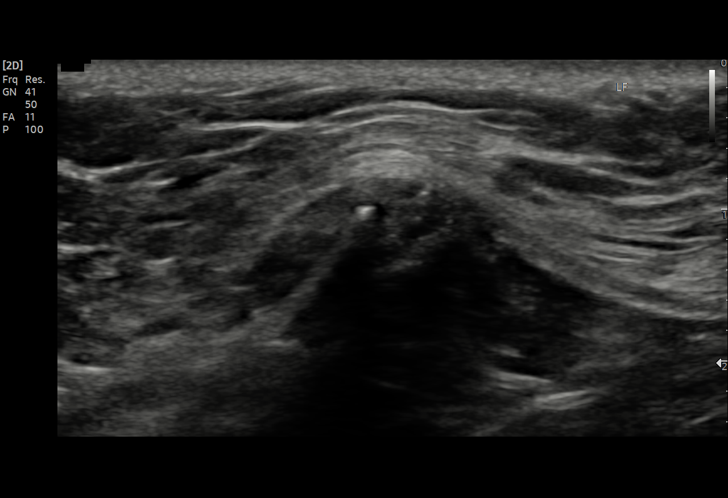
[im 20/22]
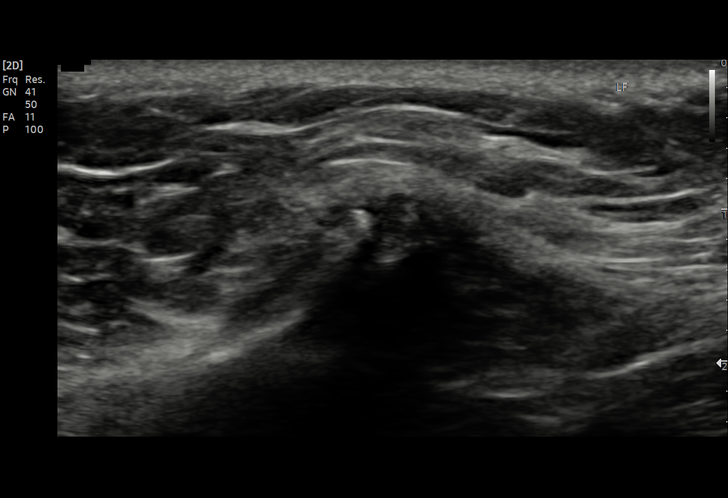
[im 22/22]
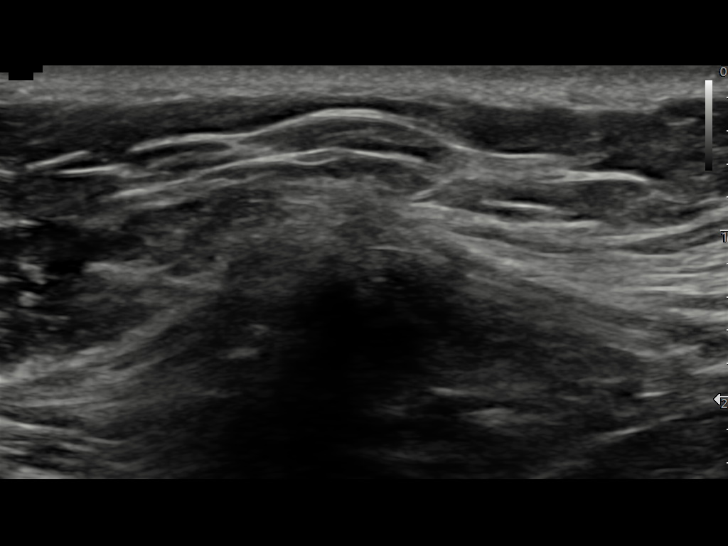

[15 of 16 positions shown; findings below may reference images not displayed]

FINDINGS: Sonographic evaluation was performed at the inferior margin of the
sternum near the xiphoid process, at the site of the palpable
abnormality. Prominent sternal xiphoid junction is identified,
corresponding to the reported palpable area. There are no focal
sonographic findings. No fluid collections.
IMPRESSION: 1. Prominent sternal xiphoid junction corresponding to the palpable
abnormality. No sonographic findings.

## 2022-11-20 DIAGNOSIS — H268 Other specified cataract: Secondary | ICD-10-CM | POA: Diagnosis not present

## 2022-11-20 DIAGNOSIS — H269 Unspecified cataract: Secondary | ICD-10-CM | POA: Diagnosis not present

## 2022-11-20 DIAGNOSIS — H40061 Primary angle closure without glaucoma damage, right eye: Secondary | ICD-10-CM | POA: Diagnosis not present

## 2022-11-20 DIAGNOSIS — H40051 Ocular hypertension, right eye: Secondary | ICD-10-CM | POA: Diagnosis not present

## 2023-01-01 DIAGNOSIS — H25812 Combined forms of age-related cataract, left eye: Secondary | ICD-10-CM | POA: Diagnosis not present

## 2023-01-01 DIAGNOSIS — H269 Unspecified cataract: Secondary | ICD-10-CM | POA: Diagnosis not present

## 2023-01-30 ENCOUNTER — Telehealth: Payer: Self-pay

## 2023-01-30 NOTE — Telephone Encounter (Signed)
  Medicaid Managed Care   Unsuccessful Outreach Note  01/30/2023 Name: Stacy Torres MRN: [id number] DOB: July 16, 1960  Referred by: Mort Sawyers, FNP Reason for referral : No chief complaint on file.   An unsuccessful telephone outreach was attempted today. The patient was referred to the case management team for assistance with care management and care coordination.   Follow Up Plan: If patient returns call to provider office, please advise to call Embedded Care Management Care Guide Nicholes Rough* at [phone]*  Nicholes Rough, CMA Care Guide [employer]

## 2023-02-14 ENCOUNTER — Encounter: Payer: Self-pay | Admitting: Family

## 2023-02-14 NOTE — Telephone Encounter (Signed)
Cancelled appointment

## 2023-02-15 NOTE — Telephone Encounter (Signed)
Noted  

## 2023-02-16 ENCOUNTER — Ambulatory Visit: Payer: Medicaid Other | Admitting: Family

## 2023-02-23 ENCOUNTER — Encounter: Payer: Self-pay | Admitting: Family

## 2023-02-23 ENCOUNTER — Ambulatory Visit: Payer: Medicaid Other | Admitting: Family

## 2023-02-23 VITALS — BP 130/82 | HR 72 | Temp 98.2°F | Ht 60.0 in | Wt 177.4 lb

## 2023-02-23 DIAGNOSIS — R1012 Left upper quadrant pain: Secondary | ICD-10-CM | POA: Diagnosis not present

## 2023-02-23 DIAGNOSIS — Z7984 Long term (current) use of oral hypoglycemic drugs: Secondary | ICD-10-CM | POA: Diagnosis not present

## 2023-02-23 DIAGNOSIS — R350 Frequency of micturition: Secondary | ICD-10-CM

## 2023-02-23 DIAGNOSIS — I10 Essential (primary) hypertension: Secondary | ICD-10-CM

## 2023-02-23 DIAGNOSIS — R102 Pelvic and perineal pain: Secondary | ICD-10-CM | POA: Insufficient documentation

## 2023-02-23 DIAGNOSIS — E1140 Type 2 diabetes mellitus with diabetic neuropathy, unspecified: Secondary | ICD-10-CM

## 2023-02-23 DIAGNOSIS — E782 Mixed hyperlipidemia: Secondary | ICD-10-CM | POA: Diagnosis not present

## 2023-02-23 DIAGNOSIS — N3000 Acute cystitis without hematuria: Secondary | ICD-10-CM

## 2023-02-23 LAB — BASIC METABOLIC PANEL
BUN: 20 mg/dL (ref 6–23)
CO2: 26 meq/L (ref 19–32)
Calcium: 9.7 mg/dL (ref 8.4–10.5)
Chloride: 108 meq/L (ref 96–112)
Creatinine, Ser: 1.07 mg/dL (ref 0.40–1.20)
GFR: 54.84 mL/min — ABNORMAL LOW (ref >60.00)
Glucose, Bld: 72 mg/dL (ref 70–99)
Potassium: 4.5 meq/L (ref 3.5–5.1)
Sodium: 143 meq/L (ref 135–145)

## 2023-02-23 LAB — POCT GLYCOSYLATED HEMOGLOBIN (HGB A1C): Hemoglobin A1C: 7.8 % — AB (ref 4.0–5.6)

## 2023-02-23 LAB — LIPID PANEL
Cholesterol: 207 mg/dL — ABNORMAL HIGH (ref 0–200)
HDL: 45 mg/dL (ref >39.00)
LDL Cholesterol: 116 mg/dL — ABNORMAL HIGH (ref 0–99)
NonHDL: 161.9
Total CHOL/HDL Ratio: 5
Triglycerides: 229 mg/dL — ABNORMAL HIGH (ref 0.0–149.0)
VLDL: 45.8 mg/dL — ABNORMAL HIGH (ref 0.0–40.0)

## 2023-02-23 LAB — POCT URINE DIPSTICK
Bilirubin, UA: NEGATIVE
Blood, UA: NEGATIVE
Glucose, UA: NEGATIVE mg/dL
Ketones, POC UA: NEGATIVE mg/dL
Leukocytes, UA: NEGATIVE
Nitrite, UA: NEGATIVE
POC PROTEIN,UA: NEGATIVE
Spec Grav, UA: 1.02 (ref 1.010–1.025)
Urobilinogen, UA: 0.2 U/dL
pH, UA: 5 (ref 5.0–8.0)

## 2023-02-23 LAB — CBC
HCT: 36.4 % (ref 36.0–46.0)
Hemoglobin: 11.6 g/dL — ABNORMAL LOW (ref 12.0–15.0)
MCHC: 32 g/dL (ref 30.0–36.0)
MCV: 97.9 fL (ref 78.0–100.0)
Platelets: 285 10*3/uL (ref 150.0–400.0)
RBC: 3.71 Mil/uL — ABNORMAL LOW (ref 3.87–5.11)
RDW: 15.1 % (ref 11.5–15.5)
WBC: 8.6 10*3/uL (ref 4.0–10.5)

## 2023-02-23 LAB — COMPREHENSIVE METABOLIC PANEL
ALT: 10 U/L (ref 0–35)
AST: 15 U/L (ref 0–37)
Albumin: 4.2 g/dL (ref 3.5–5.2)
Alkaline Phosphatase: 64 U/L (ref 39–117)
BUN: 20 mg/dL (ref 6–23)
CO2: 26 meq/L (ref 19–32)
Calcium: 9.7 mg/dL (ref 8.4–10.5)
Chloride: 108 meq/L (ref 96–112)
Creatinine, Ser: 1.07 mg/dL (ref 0.40–1.20)
GFR: 54.84 mL/min — ABNORMAL LOW (ref >60.00)
Glucose, Bld: 72 mg/dL (ref 70–99)
Potassium: 4.5 meq/L (ref 3.5–5.1)
Sodium: 143 meq/L (ref 135–145)
Total Bilirubin: 0.3 mg/dL (ref 0.2–1.2)
Total Protein: 6.6 g/dL (ref 6.0–8.3)

## 2023-02-23 LAB — AMYLASE: Amylase: 34 U/L (ref 27–131)

## 2023-02-23 LAB — LIPASE: Lipase: 51 U/L (ref 11.0–59.0)

## 2023-02-23 MED ORDER — BLOOD GLUCOSE TEST VI STRP: 1.0000 | ORAL_STRIP | Freq: Three times a day (TID) | 0 refills | Status: AC

## 2023-02-23 MED ORDER — AMOXICILLIN-POT CLAVULANATE 875-125 MG PO TABS: 1.0000 | ORAL_TABLET | Freq: Two times a day (BID) | ORAL | 0 refills | Status: DC

## 2023-02-23 MED ORDER — GLIPIZIDE 10 MG PO TABS
10.0000 mg | ORAL_TABLET | Freq: Two times a day (BID) | ORAL | 3 refills | Status: DC
Start: 2023-02-23 — End: 2023-04-16

## 2023-02-23 MED ORDER — LANCET DEVICE MISC: 1.0000 | Freq: Three times a day (TID) | 0 refills | Status: AC

## 2023-02-23 MED ORDER — BLOOD GLUCOSE MONITORING SUPPL DEVI: 1.0000 | Freq: Three times a day (TID) | 0 refills | Status: AC

## 2023-02-23 MED ORDER — LANCETS MISC. MISC: 1.0000 | Freq: Three times a day (TID) | 0 refills | Status: AC

## 2023-02-23 NOTE — Assessment & Plan Note (Signed)
Ordering amylase lipase pending results.

## 2023-02-23 NOTE — Assessment & Plan Note (Signed)
Will treat for uti vs diverticulitis based off of symptoms. rx augmentin 875/125 mg po bid x 10 days Urine culture sent and pending Lab workup in place to r/o infection/LFT elevation, pancreas abn  Red flag symptoms discussed with pt and when needed to go to ER if these symptoms occur  Can later consider u/s vs CT if ongoing

## 2023-02-23 NOTE — Assessment & Plan Note (Addendum)
Worsening, was 7.5 A1c today at 7.8  Uncontrolled, suspect might be contributing to urinary frequency.  Pt agreeable to medication change Increase glipizide to 10 mg twice daily  Continue metformin 1000 mg twice daily.  Ordering bmp today, pending kidney function determine if able to continue with metformin

## 2023-02-23 NOTE — Progress Notes (Signed)
Established Patient Office Visit  Subjective:   Patient ID: Stacy Torres, female    DOB: 07-20-1960  Age: 62 y.o. MRN: [id number]  CC:  Chief Complaint  Patient presents with   Urinary Tract Infection    HPI: Stacy Torres is a 62 y.o. female presenting on 02/23/2023 for Urinary Tract Infection  Increased urinary frequency, lower abd pain and cramping, dysuria, slight nausea, and some slight back pain. No blood seen in the urine. She states 'this is typical to her other UTI's'  Denies fever.  Bowels are normal for her.   Denies vaginal discharge. Denies vaginal itching.  She has seen a urologist in the past for repeat UTI's and states that they told her she has chronic urinary retention that will contribute to frequent UTI's.   Does report increased gas but states omeprazole causes her to have worsening gas. No change in appetite. No blood seen in stool.        ROS: Negative unless specifically indicated above in HPI.   Relevant past medical history reviewed and updated as indicated.   Allergies and medications reviewed and updated.   Current Outpatient Medications:    amoxicillin-clavulanate (AUGMENTIN) 875-125 MG tablet, Take 1 tablet by mouth 2 (two) times daily., Disp: 20 tablet, Rfl: 0   Blood Glucose Monitoring Suppl DEVI, 1 each by Does not apply route in the morning, at noon, and at bedtime. May substitute to any manufacturer covered by patient's insurance., Disp: 1 each, Rfl: 0   glipiZIDE (GLUCOTROL) 10 MG tablet, Take 1 tablet (10 mg total) by mouth 2 (two) times daily before a meal., Disp: 60 tablet, Rfl: 3   Glucose Blood (BLOOD GLUCOSE TEST STRIPS) STRP, 1 each by In Vitro route in the morning, at noon, and at bedtime. May substitute to any manufacturer covered by patient's insurance., Disp: 100 strip, Rfl: 0   Lancet Device MISC, 1 each by Does not apply route in the morning, at noon, and at bedtime. May substitute to any manufacturer covered by  patient's insurance., Disp: 1 each, Rfl: 0   Lancets Misc. MISC, 1 each by Does not apply route in the morning, at noon, and at bedtime. May substitute to any manufacturer covered by patient's insurance., Disp: 100 each, Rfl: 0   Accu-Chek FastClix Lancets MISC, Check fasting blood sugar daily. Dx: E11.9, Disp: 102 each, Rfl: 2   Aspirin 81 MG CAPS, Take by mouth., Disp: , Rfl:    atenolol (TENORMIN) 50 MG tablet, Take 1 tablet (50 mg total) by mouth daily. for blood pressure., Disp: 90 tablet, Rfl: 3   atorvastatin (LIPITOR) 40 MG tablet, Take 1 tablet (40 mg total) by mouth daily. for cholesterol., Disp: 90 tablet, Rfl: 3   blood glucose meter kit and supplies KIT, Dispense based on patient and insurance preference. Use up to BID  times daily as directed to check blood sugar. E11.89, Disp: 1 each, Rfl: 0   clobetasol cream (TEMOVATE) 0.05 %, Apply 1 Application topically 2 (two) times daily., Disp: 30 g, Rfl: 0   clotrimazole-betamethasone (LOTRISONE) cream, APPLY TO AFFECTED AREA TWICE A DAY, Disp: 30 g, Rfl: 0   glucose blood (ACCU-CHEK GUIDE) test strip, Check fasting blood sugar daily. Dx: E11.9, Disp: 100 each, Rfl: 3   Lancets Misc. (ACCU-CHEK FASTCLIX LANCET) KIT, Check fasting blood sugar daily. Dx: E11.9, Disp: 1 kit, Rfl: 0   lisinopril (ZESTRIL) 40 MG tablet, Take 1 tablet (40 mg total) by mouth daily., Disp: 90 tablet,  Rfl: 3   metFORMIN (GLUCOPHAGE) 1000 MG tablet, Take 1 tablet (1,000 mg total) by mouth 2 (two) times daily with a meal., Disp: 180 tablet, Rfl: 3   omeprazole (PRILOSEC) 20 MG capsule, Take one po qd, Disp: 90 capsule, Rfl: 3  Allergies  Allergen Reactions   Hydrochlorothiazide Other (See Comments)    Dizziness and nausea   Influenza A (H1n1) Monovalent Vaccine Other (See Comments)    H/o gbs as a child   Tradjenta [Linagliptin] Nausea Only    Nausea     Objective:   BP 130/82 (BP Location: Right Arm, Patient Position: Sitting, Cuff Size: Normal)   Pulse 72    Temp 98.2 F (36.8 C)   Ht 5' (1.524 m)   Wt 177 lb 6.4 oz (80.5 kg)   SpO2 95%   BMI 34.65 kg/m    Physical Exam Constitutional:      General: She is not in acute distress.    Appearance: Normal appearance. She is normal weight. She is not ill-appearing, toxic-appearing or diaphoretic.  Cardiovascular:     Rate and Rhythm: Normal rate.  Pulmonary:     Effort: Pulmonary effort is normal.  Abdominal:     General: Abdomen is flat. Bowel sounds are normal.     Palpations: Abdomen is soft.     Tenderness: There is abdominal tenderness in the right lower quadrant, left upper quadrant and left lower quadrant. There is no right CVA tenderness or left CVA tenderness. Negative signs include McBurney's sign.     Comments: Luq 5/10 pain   Neurological:     General: No focal deficit present.     Mental Status: She is alert and oriented to person, place, and time. Mental status is at baseline.     Motor: No weakness.  Psychiatric:        Mood and Affect: Mood normal.        Behavior: Behavior normal.        Thought Content: Thought content normal.        Judgment: Judgment normal.     Assessment & Plan:  Urinary frequency -     POCT URINE DIPSTICK -     CBC -     Amoxicillin-Pot Clavulanate; Take 1 tablet by mouth 2 (two) times daily.  Dispense: 20 tablet; Refill: 0  Type 2 diabetes mellitus with diabetic neuropathy, without long-term current use of insulin (HCC) Assessment & Plan: Worsening, was 7.5 A1c today at 7.8  Uncontrolled, suspect might be contributing to urinary frequency.  Pt agreeable to medication change Increase glipizide to 10 mg twice daily  Continue metformin 1000 mg twice daily.  Ordering bmp today, pending kidney function determine if able to continue with metformin   Orders: -     Basic metabolic panel -     CBC -     POCT glycosylated hemoglobin (Hb A1C) -     Comprehensive metabolic panel -     Blood Glucose Monitoring Suppl; 1 each by Does not apply  route in the morning, at noon, and at bedtime. May substitute to any manufacturer covered by patient's insurance.  Dispense: 1 each; Refill: 0 -     Blood Glucose Test; 1 each by In Vitro route in the morning, at noon, and at bedtime. May substitute to any manufacturer covered by patient's insurance.  Dispense: 100 strip; Refill: 0 -     Lancet Device; 1 each by Does not apply route in the morning, at noon, and  at bedtime. May substitute to any manufacturer covered by patient's insurance.  Dispense: 1 each; Refill: 0 -     Lancets Misc.; 1 each by Does not apply route in the morning, at noon, and at bedtime. May substitute to any manufacturer covered by patient's insurance.  Dispense: 100 each; Refill: 0 -     glipiZIDE; Take 1 tablet (10 mg total) by mouth 2 (two) times daily before a meal.  Dispense: 60 tablet; Refill: 3  Mixed hyperlipidemia -     Lipid panel  Primary hypertension -     CBC  Pelvic pain Assessment & Plan: Will treat for uti vs diverticulitis based off of symptoms. rx augmentin 875/125 mg po bid x 10 days Urine culture sent and pending Lab workup in place to r/o infection/LFT elevation, pancreas abn  Red flag symptoms discussed with pt and when needed to go to ER if these symptoms occur  Can later consider u/s vs CT if ongoing  Orders: -     WET PREP BY MOLECULAR PROBE -     Urine Culture  Acute cystitis without hematuria -     Amoxicillin-Pot Clavulanate; Take 1 tablet by mouth 2 (two) times daily.  Dispense: 20 tablet; Refill: 0  Left upper quadrant abdominal pain Assessment & Plan: Ordering amylase lipase pending results.   Orders: -     Amylase -     Lipase     Follow up plan: Return in about 3 months (around 05/26/2023) for f/u diabetes.  Mort Sawyers, FNP

## 2023-02-23 NOTE — Addendum Note (Signed)
Addended by: Alvina Chou on: 02/23/2023 11:39 AM   Modules accepted: Orders

## 2023-02-24 LAB — URINE CULTURE
MICRO NUMBER:: 15768709
Result:: NO GROWTH
SPECIMEN QUALITY:: ADEQUATE

## 2023-02-26 ENCOUNTER — Other Ambulatory Visit: Payer: Self-pay | Admitting: Family

## 2023-02-26 ENCOUNTER — Other Ambulatory Visit: Payer: Self-pay | Admitting: *Deleted

## 2023-02-26 DIAGNOSIS — R350 Frequency of micturition: Secondary | ICD-10-CM

## 2023-02-26 DIAGNOSIS — E782 Mixed hyperlipidemia: Secondary | ICD-10-CM

## 2023-02-26 DIAGNOSIS — R944 Abnormal results of kidney function studies: Secondary | ICD-10-CM

## 2023-02-26 DIAGNOSIS — R102 Pelvic and perineal pain: Secondary | ICD-10-CM

## 2023-02-26 MED ORDER — ATORVASTATIN CALCIUM 80 MG PO TABS
80.0000 mg | ORAL_TABLET | Freq: Every day | ORAL | 3 refills | Status: DC
Start: 2023-02-26 — End: 2023-12-19

## 2023-02-26 NOTE — Progress Notes (Signed)
How is pt feeling over the weekend with her abdominal symptoms?   Kidney function stable but still on the lower end. Due to some urinary symptoms and trend downward of kidney function will order kidney ultrasound.  She can call to schedule : [facility] outpatient imaging center off [address], [place] [address] Phone [phone]-  8-5 pm   Cholesterol better slightly but still elevated. Increase atorvasttin to 80 mg once daily.   Urine negative for infection.

## 2023-02-26 NOTE — Telephone Encounter (Signed)
Error

## 2023-02-27 ENCOUNTER — Encounter: Payer: Self-pay | Admitting: *Deleted

## 2023-04-16 ENCOUNTER — Encounter: Payer: Self-pay | Admitting: Family

## 2023-04-16 DIAGNOSIS — E1140 Type 2 diabetes mellitus with diabetic neuropathy, unspecified: Secondary | ICD-10-CM

## 2023-04-16 MED ORDER — GLIPIZIDE 10 MG PO TABS: 10.0000 mg | ORAL_TABLET | Freq: Two times a day (BID) | ORAL | 3 refills | Status: DC

## 2023-05-03 ENCOUNTER — Telehealth: Payer: Self-pay

## 2023-05-03 NOTE — Telephone Encounter (Signed)
Called patient needs appointment for mammogram as well as DM follow up

## 2023-05-04 NOTE — Telephone Encounter (Signed)
 Left message to return call to our office.

## 2023-05-10 ENCOUNTER — Other Ambulatory Visit: Payer: Self-pay | Admitting: Family

## 2023-05-10 DIAGNOSIS — K219 Gastro-esophageal reflux disease without esophagitis: Secondary | ICD-10-CM

## 2023-05-17 NOTE — Telephone Encounter (Signed)
lvm for pt to call office to schedule appt.

## 2023-05-17 NOTE — Telephone Encounter (Signed)
Please call patient due for DM follow up.

## 2023-05-18 ENCOUNTER — Other Ambulatory Visit: Payer: Self-pay | Admitting: Family

## 2023-05-18 DIAGNOSIS — I10 Essential (primary) hypertension: Secondary | ICD-10-CM

## 2023-05-21 ENCOUNTER — Other Ambulatory Visit: Payer: Self-pay | Admitting: Family

## 2023-05-21 DIAGNOSIS — I10 Essential (primary) hypertension: Secondary | ICD-10-CM

## 2023-05-22 MED ORDER — LISINOPRIL 40 MG PO TABS
40.0000 mg | ORAL_TABLET | Freq: Every day | ORAL | 0 refills | Status: DC
Start: 2023-05-22 — End: 2023-11-19

## 2023-05-30 ENCOUNTER — Other Ambulatory Visit: Payer: Self-pay | Admitting: Family

## 2023-05-30 DIAGNOSIS — I10 Essential (primary) hypertension: Secondary | ICD-10-CM

## 2023-06-19 ENCOUNTER — Encounter: Payer: Self-pay | Admitting: *Deleted

## 2023-08-10 ENCOUNTER — Other Ambulatory Visit: Payer: Self-pay | Admitting: Family

## 2023-08-10 DIAGNOSIS — E119 Type 2 diabetes mellitus without complications: Secondary | ICD-10-CM

## 2023-11-19 ENCOUNTER — Other Ambulatory Visit: Payer: Self-pay | Admitting: Family

## 2023-11-19 DIAGNOSIS — I10 Essential (primary) hypertension: Secondary | ICD-10-CM

## 2023-11-19 NOTE — Telephone Encounter (Signed)
 lvm for pt to call office to schedule appt.

## 2023-11-20 ENCOUNTER — Other Ambulatory Visit: Payer: Self-pay | Admitting: Family

## 2023-11-20 DIAGNOSIS — E119 Type 2 diabetes mellitus without complications: Secondary | ICD-10-CM

## 2023-11-20 DIAGNOSIS — I10 Essential (primary) hypertension: Secondary | ICD-10-CM

## 2023-11-21 ENCOUNTER — Telehealth: Payer: Self-pay | Admitting: Family

## 2023-11-21 NOTE — Telephone Encounter (Signed)
 [facility] and advised them that the pt is overdue for a follow up appointment and only #30 needs to be filled to ensure that she comes to her appointment at the end of the month.

## 2023-11-21 NOTE — Telephone Encounter (Signed)
 Copied from CRM #[id number]. Topic: Clinical - Medication Question >> Nov 21, 2023 10:18 AM [name] wrote: Reason for CRM: Stacy Torres has question about Metformin  Refill. She said they sent a request yesterday for a 90 day because insurance wants her to. She said it was sent over for 30 day and is wondering if 90 day can be sent. Please call Pharmacy.

## 2023-11-23 ENCOUNTER — Other Ambulatory Visit: Payer: Self-pay | Admitting: Family

## 2023-11-23 DIAGNOSIS — K219 Gastro-esophageal reflux disease without esophagitis: Secondary | ICD-10-CM

## 2023-11-30 ENCOUNTER — Ambulatory Visit: Admitting: Family

## 2023-12-01 ENCOUNTER — Other Ambulatory Visit: Payer: Self-pay | Admitting: Family

## 2023-12-01 DIAGNOSIS — I10 Essential (primary) hypertension: Secondary | ICD-10-CM

## 2023-12-19 ENCOUNTER — Encounter: Payer: Self-pay | Admitting: Family

## 2023-12-19 ENCOUNTER — Ambulatory Visit (INDEPENDENT_AMBULATORY_CARE_PROVIDER_SITE_OTHER): Admitting: Family

## 2023-12-19 VITALS — BP 172/82 | HR 70 | Temp 98.1°F | Ht 60.0 in | Wt 173.2 lb

## 2023-12-19 DIAGNOSIS — E1140 Type 2 diabetes mellitus with diabetic neuropathy, unspecified: Secondary | ICD-10-CM | POA: Diagnosis not present

## 2023-12-19 DIAGNOSIS — K219 Gastro-esophageal reflux disease without esophagitis: Secondary | ICD-10-CM | POA: Diagnosis not present

## 2023-12-19 DIAGNOSIS — I10 Essential (primary) hypertension: Secondary | ICD-10-CM | POA: Diagnosis not present

## 2023-12-19 DIAGNOSIS — D509 Iron deficiency anemia, unspecified: Secondary | ICD-10-CM | POA: Diagnosis not present

## 2023-12-19 DIAGNOSIS — E782 Mixed hyperlipidemia: Secondary | ICD-10-CM | POA: Diagnosis not present

## 2023-12-19 LAB — COMPREHENSIVE METABOLIC PANEL WITH GFR
ALT: 12 U/L (ref 0–35)
AST: 13 U/L (ref 0–37)
Albumin: 4.3 g/dL (ref 3.5–5.2)
Alkaline Phosphatase: 59 U/L (ref 39–117)
BUN: 15 mg/dL (ref 6–23)
CO2: 26 meq/L (ref 19–32)
Calcium: 10.1 mg/dL (ref 8.4–10.5)
Chloride: 104 meq/L (ref 96–112)
Creatinine, Ser: 0.87 mg/dL (ref 0.40–1.20)
GFR: 69.89 mL/min (ref >60.00)
Glucose, Bld: 188 mg/dL — ABNORMAL HIGH (ref 70–99)
Potassium: 4.2 meq/L (ref 3.5–5.1)
Sodium: 140 meq/L (ref 135–145)
Total Bilirubin: 0.3 mg/dL (ref 0.2–1.2)
Total Protein: 6.7 g/dL (ref 6.0–8.3)

## 2023-12-19 LAB — CBC
HCT: 34.9 % — ABNORMAL LOW (ref 36.0–46.0)
Hemoglobin: 11.3 g/dL — ABNORMAL LOW (ref 12.0–15.0)
MCHC: 32.4 g/dL (ref 30.0–36.0)
MCV: 92 fl (ref 78.0–100.0)
Platelets: 264 K/uL (ref 150.0–400.0)
RBC: 3.79 Mil/uL — ABNORMAL LOW (ref 3.87–5.11)
RDW: 15.5 % (ref 11.5–15.5)
WBC: 7.8 K/uL (ref 4.0–10.5)

## 2023-12-19 LAB — POCT GLYCOSYLATED HEMOGLOBIN (HGB A1C): Hemoglobin A1C: 7.7 % — AB (ref 4.0–5.6)

## 2023-12-19 LAB — LIPID PANEL
Cholesterol: 195 mg/dL (ref 0–200)
HDL: 54.2 mg/dL (ref >39.00)
LDL Cholesterol: 114 mg/dL — ABNORMAL HIGH (ref 0–99)
NonHDL: 141.2
Total CHOL/HDL Ratio: 4
Triglycerides: 137 mg/dL (ref 0.0–149.0)
VLDL: 27.4 mg/dL (ref 0.0–40.0)

## 2023-12-19 LAB — IBC + FERRITIN
Ferritin: 10.4 ng/mL (ref 10.0–291.0)
Iron: 52 ug/dL (ref 42–145)
Saturation Ratios: 14 % — ABNORMAL LOW (ref 20.0–50.0)
TIBC: 372.4 ug/dL (ref 250.0–450.0)
Transferrin: 266 mg/dL (ref 212.0–360.0)

## 2023-12-19 LAB — MICROALBUMIN / CREATININE URINE RATIO
Creatinine,U: 43.5 mg/dL
Microalb Creat Ratio: UNDETERMINED mg/g (ref 0.0–30.0)
Microalb, Ur: <0.7 mg/dL

## 2023-12-19 MED ORDER — GLIPIZIDE 10 MG PO TABS: 10.0000 mg | ORAL_TABLET | Freq: Two times a day (BID) | ORAL | 3 refills | Status: AC

## 2023-12-19 MED ORDER — LISINOPRIL 40 MG PO TABS: 40.0000 mg | ORAL_TABLET | Freq: Every day | ORAL | 3 refills | Status: AC

## 2023-12-19 MED ORDER — ATENOLOL 50 MG PO TABS: 50.0000 mg | ORAL_TABLET | Freq: Every day | ORAL | 3 refills | Status: AC

## 2023-12-19 MED ORDER — ATORVASTATIN CALCIUM 80 MG PO TABS: 80.0000 mg | ORAL_TABLET | Freq: Every day | ORAL | 3 refills | Status: AC

## 2023-12-19 MED ORDER — OMEPRAZOLE 20 MG PO CPDR: 20.0000 mg | DELAYED_RELEASE_CAPSULE | Freq: Every day | ORAL | 3 refills | Status: AC

## 2023-12-19 NOTE — Progress Notes (Signed)
 6  Established Patient Office Visit  Subjective:      CC:  Chief Complaint  Patient presents with   Medical Management of Chronic Issues    HPI: Stacy Torres is a 63 y.o. female presenting on 12/19/2023 for Medical Management of Chronic Issues .  Discussed the use of AI scribe software for clinical note transcription with the patient, who gave verbal consent to proceed.  History of Present Illness Stacy Torres is a 63 year old female with diabetes who presents for a general follow-up and medication refill.  She manages her diabetes with metformin  1000 mg twice daily and glipizide  10 mg twice daily. Her A1c levels have been slightly increasing, which she attributes to dietary indiscretions, particularly increased consumption of sweets due to recent family stressors, including the disappearance of her niece and the unexpected death of her sister. No symptoms of hypoglycemia such as dizziness or feeling faint.  She has a history of anemia, which has been stable. Kidney function is being monitored due to potential diabetes-related effects. Her cholesterol levels are due for a repeat check, with her LDL previously recorded at 116 mg/dL.  She diligently checks her feet daily to prevent unnoticed injuries.  Socially, she is adjusting to life changes, including a recent divorce finalized after 42 years of marriage. She is also adapting to her grandson starting daycare, which has left her with more free time. She plans to resume walking several miles a day, a routine she previously enjoyed.         Social history:  Relevant past medical, surgical, family and social history reviewed and updated as indicated. Interim medical history since our last visit reviewed.  Allergies and medications reviewed and updated.  DATA REVIEWED: CHART IN EPIC     ROS: Negative unless specifically indicated above in HPI.    Current Outpatient Medications:    Accu-Chek FastClix  Lancets MISC, Check fasting blood sugar daily. Dx: E11.9, Disp: 102 each, Rfl: 2   Aspirin 81 MG CAPS, Take by mouth., Disp: , Rfl:    atenolol  (TENORMIN ) 50 MG tablet, TAKE 1 TABLET (50 MG TOTAL) BY MOUTH DAILY FOR BLOOD PRESSURE., Disp: 90 tablet, Rfl: 1   atorvastatin  (LIPITOR) 80 MG tablet, Take 1 tablet (80 mg total) by mouth daily., Disp: 90 tablet, Rfl: 3   blood glucose meter kit and supplies KIT, Dispense based on patient and insurance preference. Use up to BID  times daily as directed to check blood sugar. E11.89, Disp: 1 each, Rfl: 0   Blood Glucose Monitoring Suppl DEVI, 1 each by Does not apply route in the morning, at noon, and at bedtime. May substitute to any manufacturer covered by patient's insurance., Disp: 1 each, Rfl: 0   clobetasol  cream (TEMOVATE ) 0.05 %, Apply 1 Application topically 2 (two) times daily., Disp: 30 g, Rfl: 0   clotrimazole -betamethasone  (LOTRISONE ) cream, APPLY TO AFFECTED AREA TWICE A DAY, Disp: 30 g, Rfl: 0   glipiZIDE  (GLUCOTROL ) 10 MG tablet, Take 1 tablet (10 mg total) by mouth 2 (two) times daily before a meal., Disp: 180 tablet, Rfl: 3   glucose blood (ACCU-CHEK GUIDE) test strip, Check fasting blood sugar daily. Dx: E11.9, Disp: 100 each, Rfl: 3   Lancets Misc. (ACCU-CHEK FASTCLIX LANCET) KIT, Check fasting blood sugar daily. Dx: E11.9, Disp: 1 kit, Rfl: 0   lisinopril  (ZESTRIL ) 40 MG tablet, TAKE 1 TABLET (40 MG TOTAL) BY MOUTH DAILY. NEED OFFICE VISIT FOR FURTHER REFILLS, Disp: 30 tablet, Rfl: 0  metFORMIN  (GLUCOPHAGE ) 1000 MG tablet, TAKE 1 TABLET BY MOUTH 2 TIMES DAILY WITH A MEAL FOR DIABETES, Disp: 60 tablet, Rfl: 0   omeprazole  (PRILOSEC) 20 MG capsule, TAKE 1 CAPSULE BY MOUTH ONCE DAILY, Disp: 30 capsule, Rfl: 0        Objective:    Title   Diabetic Foot Exam - detailed Visual Foot Exam completed.: Yes  Is there a history of foot ulcer?: No Is there a foot ulcer now?: No Is there swelling?: No Is there elevated skin temperature?:  No Is there abnormal foot shape?: No Is there a claw toe deformity?: No Are the toenails long?: No Are the toenails thick?: No Are the toenails ingrown?: No Is the skin thin, fragile, shiny and hairless?: No Normal Range of Motion?: Yes Is there foot or ankle muscle weakness?: No Do you have pain in calf while walking?: No Are the shoes appropriate in style and fit?: Yes Can the patient see the bottom of their feet?: Yes Pulse Foot Exam completed.: Yes   Right Posterior Tibialis: Present Left posterior Tibialis: Present   Right Dorsalis Pedis: Present Left Dorsalis Pedis: Present     Semmes-Weinstein Monofilament Test + means has sensation and - means no sensation  R Foot Test Control: Neg L Foot Test Control: Neg   R Site 1-Great Toe: Neg L Site 1-Great Toe: Neg   R Site 4: Neg L Site 4: Neg   R site 5: Neg L Site 5: Neg  R Site 6: Neg L Site 6: Pos     Image components are not supported.   Image components are not supported. Image components are not supported.  Tuning Fork Comments        BP (!) 172/82 (BP Location: Left Arm, Patient Position: Sitting, Cuff Size: Large)   Pulse 70   Temp 98.1 F (36.7 C) (Temporal)   Ht 5' (1.524 m)   Wt 173 lb 3.2 oz (78.6 kg)   SpO2 99%   BMI 33.83 kg/m   Physical Exam NEUROLOGICAL: Decreased sensation in left foot.  Wt Readings from Last 3 Encounters:  12/19/23 173 lb 3.2 oz (78.6 kg)  02/23/23 177 lb 6.4 oz (80.5 kg)  08/31/22 165 lb (74.8 kg)    Physical Exam Vitals reviewed.  Constitutional:      General: She is not in acute distress.    Appearance: Normal appearance. She is normal weight. She is not ill-appearing, toxic-appearing or diaphoretic.  HENT:     Head: Normocephalic.  Cardiovascular:     Rate and Rhythm: Normal rate and regular rhythm.     Pulses:          Popliteal pulses are 2+ on the right side and 2+ on the left side.       Dorsalis pedis pulses are 2+ on the right side and 2+ on the  left side.  Pulmonary:     Effort: Pulmonary effort is normal.     Breath sounds: Normal breath sounds.  Musculoskeletal:        General: Normal range of motion.     Right lower leg: No edema.     Left lower leg: No edema.  Neurological:     General: No focal deficit present.     Mental Status: She is alert and oriented to person, place, and time. Mental status is at baseline.  Psychiatric:        Mood and Affect: Mood normal.        Behavior:  Behavior normal.        Thought Content: Thought content normal.        Judgment: Judgment normal.          Results LABS LDL: 116 (02/2023)  Assessment & Plan:   Assessment and Plan Assessment & Plan Type 2 diabetes mellitus with diabetic polyneuropathy Type 2 diabetes mellitus with elevated A1c, managed with metformin  1000 mg twice daily and glipizide  10 mg twice daily. No hypoglycemia reported. Mild neuropathy in the left foot, affecting the sixth and second toes. Discussed potential switch from glipizide  to injectable medications like Mounjaro, Trulicity, or Ozempic due to age and hypoglycemia risk. Emphasized daily foot checks due to neuropathy. - Perform finger stick glucose test - Discuss potential switch from glipizide  to an injectable medication - Emphasize daily foot checks  Chronic kidney disease, unspecified Chronic kidney disease likely secondary to diabetes. Monitoring kidney function closely.  Mixed hyperlipidemia Mixed hyperlipidemia with LDL at 116 mg/dL. Goal to reduce LDL to less than 70 mg/dL due to increased cardiovascular risk associated with diabetes.  Essential hypertension Essential hypertension managed with atenolol . - Prescribe atenolol  for a one-year supply -Pt advised of the following:  Continue medication as prescribed. Monitor blood pressure periodically and/or when you feel symptomatic. Goal is <130/90 on average. Ensure that you have rested for 30 minutes prior to checking your blood pressure. Record  your readings and bring them to your next visit if necessary.work on a low sodium diet.   Recording duration: 10 minutes      Return in about 6 months (around 06/17/2024) for f/u CPE.     Ginger Patrick, MSN, APRN, FNP-C [facility]

## 2023-12-20 ENCOUNTER — Ambulatory Visit: Payer: Self-pay | Admitting: Family

## 2024-01-23 ENCOUNTER — Other Ambulatory Visit: Payer: Self-pay | Admitting: Family

## 2024-01-23 DIAGNOSIS — E119 Type 2 diabetes mellitus without complications: Secondary | ICD-10-CM

## 2024-02-13 ENCOUNTER — Telehealth: Payer: Self-pay | Admitting: Family

## 2024-02-13 NOTE — Telephone Encounter (Signed)
 LVM to schedule for Welcome to Medicare appt
